# Patient Record
Sex: Male | Born: 1975
Health system: Southern US, Community
[De-identification: ages and names within clinical notes are randomized; demographics above are authoritative.]

## PROBLEM LIST (undated history)

## (undated) DIAGNOSIS — G4733 Obstructive sleep apnea (adult) (pediatric): Secondary | ICD-10-CM

## (undated) DIAGNOSIS — M5134 Other intervertebral disc degeneration, thoracic region: Secondary | ICD-10-CM

## (undated) DIAGNOSIS — E119 Type 2 diabetes mellitus without complications: Secondary | ICD-10-CM

## (undated) HISTORY — PX: NO PAST SURGERIES: SHX2092

## (undated) HISTORY — DX: Type 2 diabetes mellitus without complications: E11.9

## (undated) HISTORY — DX: Obstructive sleep apnea (adult) (pediatric): G47.33

## (undated) HISTORY — DX: Other intervertebral disc degeneration, thoracic region: M51.34

---

## 1998-01-24 ENCOUNTER — Emergency Department (HOSPITAL_COMMUNITY): Admission: EM | Admit: 1998-01-24 | Discharge: 1998-01-24 | Payer: Self-pay | Admitting: Emergency Medicine

## 1998-03-28 ENCOUNTER — Emergency Department (HOSPITAL_COMMUNITY): Admission: EM | Admit: 1998-03-28 | Discharge: 1998-03-28 | Payer: Self-pay | Admitting: Emergency Medicine

## 1999-04-25 ENCOUNTER — Encounter: Payer: Self-pay | Admitting: Emergency Medicine

## 1999-04-25 ENCOUNTER — Emergency Department (HOSPITAL_COMMUNITY): Admission: EM | Admit: 1999-04-25 | Discharge: 1999-04-25 | Payer: Self-pay | Admitting: Emergency Medicine

## 2001-04-06 ENCOUNTER — Emergency Department (HOSPITAL_COMMUNITY): Admission: EM | Admit: 2001-04-06 | Discharge: 2001-04-06 | Payer: Self-pay | Admitting: Emergency Medicine

## 2003-05-05 ENCOUNTER — Emergency Department (HOSPITAL_COMMUNITY): Admission: EM | Admit: 2003-05-05 | Discharge: 2003-05-05 | Payer: Self-pay | Admitting: Emergency Medicine

## 2003-07-05 ENCOUNTER — Emergency Department (HOSPITAL_COMMUNITY): Admission: AD | Admit: 2003-07-05 | Discharge: 2003-07-05 | Payer: Self-pay | Admitting: Family Medicine

## 2003-07-05 ENCOUNTER — Encounter: Payer: Self-pay | Admitting: Family Medicine

## 2004-11-16 ENCOUNTER — Emergency Department (HOSPITAL_COMMUNITY): Admission: EM | Admit: 2004-11-16 | Discharge: 2004-11-16 | Payer: Self-pay | Admitting: Family Medicine

## 2005-06-04 ENCOUNTER — Ambulatory Visit: Payer: Self-pay | Admitting: Internal Medicine

## 2005-08-18 ENCOUNTER — Emergency Department (HOSPITAL_COMMUNITY): Admission: EM | Admit: 2005-08-18 | Discharge: 2005-08-18 | Payer: Self-pay | Admitting: Emergency Medicine

## 2005-09-18 ENCOUNTER — Encounter: Admission: RE | Admit: 2005-09-18 | Discharge: 2005-10-31 | Payer: Self-pay | Admitting: Family Medicine

## 2005-11-28 ENCOUNTER — Ambulatory Visit: Payer: Self-pay | Admitting: Internal Medicine

## 2005-11-29 ENCOUNTER — Ambulatory Visit: Payer: Self-pay | Admitting: Internal Medicine

## 2005-12-05 ENCOUNTER — Ambulatory Visit (HOSPITAL_BASED_OUTPATIENT_CLINIC_OR_DEPARTMENT_OTHER): Admission: RE | Admit: 2005-12-05 | Discharge: 2005-12-05 | Payer: Self-pay | Admitting: Internal Medicine

## 2005-12-16 ENCOUNTER — Ambulatory Visit: Payer: Self-pay | Admitting: Internal Medicine

## 2006-08-08 DIAGNOSIS — L301 Dyshidrosis [pompholyx]: Secondary | ICD-10-CM | POA: Insufficient documentation

## 2006-08-08 DIAGNOSIS — R7309 Other abnormal glucose: Secondary | ICD-10-CM | POA: Insufficient documentation

## 2006-08-08 DIAGNOSIS — K219 Gastro-esophageal reflux disease without esophagitis: Secondary | ICD-10-CM | POA: Insufficient documentation

## 2006-09-11 DIAGNOSIS — B353 Tinea pedis: Secondary | ICD-10-CM | POA: Insufficient documentation

## 2007-09-13 ENCOUNTER — Emergency Department (HOSPITAL_COMMUNITY): Admission: EM | Admit: 2007-09-13 | Discharge: 2007-09-13 | Payer: Self-pay | Admitting: Emergency Medicine

## 2007-11-09 ENCOUNTER — Emergency Department (HOSPITAL_COMMUNITY): Admission: EM | Admit: 2007-11-09 | Discharge: 2007-11-10 | Payer: Self-pay | Admitting: Emergency Medicine

## 2008-02-11 ENCOUNTER — Emergency Department (HOSPITAL_COMMUNITY): Admission: EM | Admit: 2008-02-11 | Discharge: 2008-02-11 | Payer: Self-pay | Admitting: Emergency Medicine

## 2008-02-20 ENCOUNTER — Emergency Department (HOSPITAL_COMMUNITY): Admission: EM | Admit: 2008-02-20 | Discharge: 2008-02-20 | Payer: Self-pay | Admitting: Emergency Medicine

## 2010-10-14 ENCOUNTER — Encounter: Payer: Self-pay | Admitting: Chiropractor

## 2011-02-09 NOTE — Procedures (Signed)
Cory Lewis, Cory Lewis                ACCOUNT NO.:  [id number]   MEDICAL RECORD NO.:  [id number]          PATIENT TYPE:  OUT   LOCATION:  SLEEP CENTER                 FACILITY:  [facility]   PHYSICIAN:  Clinton D. Maple Hudson, M.D. DATE OF BIRTH:  [birth year]   DATE OF STUDY:  12/05/2005                              NOCTURNAL POLYSOMNOGRAM   REFERRING PHYSICIAN:  Dr. Coralie Carpen.   DATE OF STUDY:  December 05, 2005.   INDICATION FOR STUDY:  Hypersomnia with sleep apnea.   EPWORTH SLEEPINESS SCORE:  5/24.   BMI:  43.   WEIGHT:  240 pounds.   HOME MEDICATIONS:  No home medications listed.   A diagnostic NPSG was requested.   SLEEP ARCHITECTURE:  Total sleep time 351 minutes with sleep efficiency 99%.  Stage I was 3%, stage II 61%, stages III and IV 22%, REM 14% of total sleep  time.  Sleep latency 2 minutes, REM latency 140 minutes, awake after sleep  onset 3 minutes, arousal index 26 indicating increased fragmentation.  No  bedtime medications reported.  This study was done as a daytime study  coordinated with the patient's third shift work schedule.  Sleep onset was  at 9:23 a.m. and recording ended at 3:18 p.m.   RESPIRATORY DATA:  Apnea/hypopnea index (AHI, RDI) 25.8 obstructive events  per hour indicating moderate obstructive sleep apnea/hypopnea syndrome.  This reflected 105 obstructive apneas and 46 hypopneas.  Most sleep and  therefore most events were reported while supine.  Late in the study, he  slept for while on his right side and reported obstructive events in that  position as well.  REM RDI 97.2.  NPSG protocol was requested and followed.   OXYGEN DATA:  Extremely loud snoring with oxygen desaturation to a nadir of  8%.  Mean oxygen saturation through the study was 95% on room air.   CARDIAC DATA:  Normal sinus rhythm.   MOVEMENT/PARASOMNIA:  Occasional limb jerks with little effect on sleep.  No  bathroom trips.   IMPRESSION/RECOMMENDATIONS:  1.  Daytime study  coordinated with the patient's third shift work schedule.  2.  Moderate obstructive sleep apnea/hypopnea syndrome, AHI 25.8 per hour      with most events and most sleep while supine.  Events were more common      while in REM (AHI 97.2).  Very loud snoring with oxygen desaturation to      a nadir of 80% on room air.  3.  Consider return for C-PAP titration or evaluate for alternative      therapies as appropriate.      Clinton D. Maple Hudson, M.D.  Diplomate, Biomedical engineer of Sleep Medicine  Electronically Signed     CDY/MEDQ  D:  12/16/2005 11:03:40  T:  12/18/2005 00:14:13  Job:  [id number]

## 2011-06-20 LAB — POCT I-STAT, CHEM 8
BUN: 8
Calcium, Ion: 1.16
Chloride: 103
Creatinine, Ser: 1.1
HCT: 39
TCO2: 24

## 2011-06-20 LAB — CBC
HCT: 37.7 — ABNORMAL LOW
Hemoglobin: 12.8 — ABNORMAL LOW
MCV: 87.9
WBC: 6.2

## 2011-06-20 LAB — URINALYSIS, ROUTINE W REFLEX MICROSCOPIC
Nitrite: NEGATIVE
Specific Gravity, Urine: 1.03
pH: 5.5

## 2011-06-20 LAB — DIFFERENTIAL
Basophils Absolute: 0
Eosinophils Relative: 1

## 2011-06-29 LAB — DIFFERENTIAL
Basophils Absolute: 0.1
Basophils Relative: 1
Eosinophils Absolute: 0.1 — ABNORMAL LOW
Neutrophils Relative %: 33 — ABNORMAL LOW

## 2011-06-29 LAB — CBC
MCHC: 34.8
MCV: 86.6
RBC: 5
RDW: 12.7

## 2011-06-29 LAB — BASIC METABOLIC PANEL: BUN: 9

## 2011-12-16 ENCOUNTER — Emergency Department (INDEPENDENT_AMBULATORY_CARE_PROVIDER_SITE_OTHER)
Admission: EM | Admit: 2011-12-16 | Discharge: 2011-12-16 | Disposition: A | Discharge status: Home / Self Care | Payer: Self-pay | Source: Home / Self Care

## 2011-12-16 ENCOUNTER — Encounter (HOSPITAL_COMMUNITY): Payer: Self-pay | Admitting: *Deleted

## 2011-12-16 DIAGNOSIS — B356 Tinea cruris: Secondary | ICD-10-CM

## 2011-12-16 DIAGNOSIS — M545 Low back pain, unspecified: Secondary | ICD-10-CM

## 2011-12-16 MED ORDER — TRAMADOL HCL 50 MG PO TABS: 50.0000 mg | ORAL_TABLET | Freq: Four times a day (QID) | ORAL | Status: AC | PRN

## 2011-12-16 MED ORDER — IBUPROFEN 800 MG PO TABS: 800.0000 mg | ORAL_TABLET | Freq: Three times a day (TID) | ORAL | Status: AC

## 2011-12-16 MED ORDER — KETOCONAZOLE 2 % EX CREA: TOPICAL_CREAM | Freq: Two times a day (BID) | CUTANEOUS | Status: DC

## 2011-12-16 MED ORDER — KETOCONAZOLE 2 % EX CREA: TOPICAL_CREAM | Freq: Two times a day (BID) | CUTANEOUS | Status: AC

## 2011-12-16 NOTE — ED Provider Notes (Signed)
Medical screening examination/treatment/procedure(s) were performed by non-physician practitioner and as supervising physician I was immediately available for consultation/collaboration.  Raynald Blend, MD 12/16/11 [phone]

## 2011-12-16 NOTE — ED Provider Notes (Signed)
History     CSN: [id number]  Arrival date & time 12/16/11  1331   None     Chief Complaint  Patient presents with  . Back Pain  . Testicle Pain    (Consider location/radiation/quality/duration/timing/severity/associated sxs/prior treatment) HPI Comments: Patient presents with c/o low back pain x 1 week. Pain is primarily in Rt lower back, but also sometimes Lt lower back. No trauma or previous hx of low back pain. Pain is worse with prolonged sitting or lying. He has tried Nyquil for pain relief without improvement. Intermittently he will have a sharp pain from his Lt lower back radiate to his Lt groin and testicle area. He notices this more with movement of his Lt leg while lying in bed. He also reports an itchy dry rash of his groin x 2 weeks, "like jock itch." He denies dysuria or penile discharge. He is not concerned today for STDs.    Past Medical History  Diagnosis Date  . Asthma     History reviewed. No pertinent past surgical history.  History reviewed. No pertinent family history.  History  Substance Use Topics  . Smoking status: Never Smoker   . Smokeless tobacco: Not on file  . Alcohol Use: No      Review of Systems  Allergies  Review of patient's allergies indicates no known allergies.  Home Medications   Current Outpatient Rx  Name Route Sig Dispense Refill  . IBUPROFEN 800 MG PO TABS Oral Take 1 tablet (800 mg total) by mouth 3 (three) times daily. 15 tablet 0  . KETOCONAZOLE 2 % EX CREA Topical Apply topically 2 (two) times daily. 15 g 0  . TRAMADOL HCL 50 MG PO TABS Oral Take 1 tablet (50 mg total) by mouth every 6 (six) hours as needed for pain. 12 tablet 0    BP 131/72  Pulse 75  Temp(Src) 98.2 F (36.8 C) (Oral)  Resp 16  SpO2 100%  Physical Exam  Nursing note and vitals reviewed. Constitutional: He appears well-developed and well-nourished. No distress.  HENT:  Head: Normocephalic and atraumatic.  Cardiovascular: Normal rate, regular  rhythm and normal heart sounds.   Pulmonary/Chest: Effort normal and breath sounds normal. No respiratory distress.  Abdominal: Soft. Bowel sounds are normal. He exhibits no distension and no mass. There is no tenderness.  Genitourinary: Testes normal.       Penis not examined - covered with his hand. Hyperpigmented mildly dry rash noted bilat groin.  Musculoskeletal:       Lumbar back: He exhibits normal range of motion, no tenderness, no bony tenderness, no swelling, no deformity and no spasm.       Neg SLR bilat.   Neurological:  Reflex Scores:      Patellar reflexes are 2+ on the right side and 2+ on the left side. Skin: Skin is warm and dry.  Psychiatric: He has a normal mood and affect.    ED Course  Procedures (including critical care time)  Labs Reviewed - No data to display No results found.   1. Lumbar back pain   2. Tinea cruris       MDM          Melody Comas, PA 12/16/11 1529

## 2011-12-16 NOTE — Discharge Instructions (Signed)
Back Pain, Adult Low back pain is very common. About 1 in 5 people have back pain.The cause of low back pain is rarely dangerous. The pain often gets better over time.About half of people with a sudden onset of back pain feel better in just 2 weeks. About 8 in 10 people feel better by 6 weeks.  CAUSES Some common causes of back pain include:  Strain of the muscles or ligaments supporting the spine.   Wear and tear (degeneration) of the spinal discs.   Arthritis.   Direct injury to the back.  DIAGNOSIS Most of the time, the direct cause of low back pain is not known.However, back pain can be treated effectively even when the exact cause of the pain is unknown.Answering your caregiver's questions about your overall health and symptoms is one of the most accurate ways to make sure the cause of your pain is not dangerous. If your caregiver needs more information, he or she may order lab work or imaging tests (X-rays or MRIs).However, even if imaging tests show changes in your back, this usually does not require surgery. HOME CARE INSTRUCTIONS For many people, back pain returns.Since low back pain is rarely dangerous, it is often a condition that people can learn to manageon their own.   Remain active. It is stressful on the back to sit or stand in one place. Do not sit, drive, or stand in one place for more than 30 minutes at a time. Take short walks on level surfaces as soon as pain allows.Try to increase the length of time you walk each day.   Do not stay in bed.Resting more than 1 or 2 days can delay your recovery.   Do not avoid exercise or work.Your body is made to move.It is not dangerous to be active, even though your back may hurt.Your back will likely heal faster if you return to being active before your pain is gone.   Pay attention to your body when you bend and lift. Many people have less discomfortwhen lifting if they bend their knees, keep the load close to their  bodies,and avoid twisting. Often, the most comfortable positions are those that put less stress on your recovering back.   Find a comfortable position to sleep. Use a firm mattress and lie on your side with your knees slightly bent. If you lie on your back, put a pillow under your knees.   Only take over-the-counter or prescription medicines as directed by your caregiver. Over-the-counter medicines to reduce pain and inflammation are often the most helpful.Your caregiver may prescribe muscle relaxant drugs.These medicines help dull your pain so you can more quickly return to your normal activities and healthy exercise.   Put ice on the injured area.   Put ice in a plastic bag.   Place a towel between your skin and the bag.   Leave the ice on for 15 to 20 minutes, 3 to 4 times a day for the first 2 to 3 days. After that, ice and heat may be alternated to reduce pain and spasms.   Ask your caregiver about trying back exercises and gentle massage. This may be of some benefit.   Avoid feeling anxious or stressed.Stress increases muscle tension and can worsen back pain.It is important to recognize when you are anxious or stressed and learn ways to manage it.Exercise is a great option.  SEEK MEDICAL CARE IF:  You have pain that is not relieved with rest or medicine.   You have   pain that does not improve in 1 week.   You have new symptoms.   You are generally not feeling well.  SEEK IMMEDIATE MEDICAL CARE IF:   You have pain that radiates from your back into your legs.   You develop new bowel or bladder control problems.   You have unusual weakness or numbness in your arms or legs.   You develop nausea or vomiting.   You develop abdominal pain.   You feel faint.  Document Released: 09/10/2005 Document Revised: 08/30/2011 Document Reviewed: 01/29/2011 [facility] Patient Information 2012 [place], [place].  Jock Itch Jock itch is a germ infection of the groin and upper thighs. The  type of germ that causes jock itch is a fungus. It is itchy and often feels like it is burning. It is common in people who play sports. Sweating and wearing certain athletic gear can cause this type of rash. HOME CARE  Take your medicines as told. Finish them even if you start to feel better.   Wear loose-fitting clothing.   Men should wear cotton boxer shorts.   Women should wear cotton underwear.   Avoid hot baths.   Dry the groin area well after bathing.  GET HELP RIGHT AWAY IF:   Your rash is worse or spreading.   Your rash returns after treatment is finished.   Your rash is not gone in 4 weeks.   The area becomes red, warm, tender, and puffy (swollen).   You have a fever.  MAKE SURE YOU:  Understand these instructions.   Will watch your condition.   Will get help right away if you are not doing well or get worse.  Document Released: 12/05/2009 Document Revised: 08/30/2011 Document Reviewed: 12/05/2009 [facility] Patient Information 2012 [place], [place].

## 2011-12-16 NOTE — ED Notes (Signed)
Pt with c/o low back pain - pain left testicle and rash x one week - denies swelling - denies urinary symptoms or penile discharge

## 2013-03-16 ENCOUNTER — Encounter (HOSPITAL_COMMUNITY): Payer: Self-pay | Admitting: Emergency Medicine

## 2013-03-16 ENCOUNTER — Emergency Department (HOSPITAL_COMMUNITY)
Admission: EM | Admit: 2013-03-16 | Discharge: 2013-03-16 | Disposition: A | Discharge status: Home / Self Care | Payer: 59 | Source: Home / Self Care | Attending: Emergency Medicine | Admitting: Emergency Medicine

## 2013-03-16 ENCOUNTER — Emergency Department (INDEPENDENT_AMBULATORY_CARE_PROVIDER_SITE_OTHER): Payer: 59

## 2013-03-16 DIAGNOSIS — R1011 Right upper quadrant pain: Secondary | ICD-10-CM

## 2013-03-16 DIAGNOSIS — R0789 Other chest pain: Secondary | ICD-10-CM

## 2013-03-16 LAB — POCT I-STAT, CHEM 8
Chloride: 105 mEq/L (ref 96–112)
Glucose, Bld: 89 mg/dL (ref 70–99)
HCT: 38 % — ABNORMAL LOW (ref 39.0–52.0)
Potassium: 4 mEq/L (ref 3.5–5.1)

## 2013-03-16 MED ORDER — METHOCARBAMOL 500 MG PO TABS: 500.0000 mg | ORAL_TABLET | Freq: Three times a day (TID) | ORAL | Status: DC

## 2013-03-16 MED ORDER — TRAMADOL HCL 50 MG PO TABS: 100.0000 mg | ORAL_TABLET | Freq: Three times a day (TID) | ORAL | Status: DC | PRN

## 2013-03-16 MED ORDER — OMEPRAZOLE 20 MG PO CPDR: 20.0000 mg | DELAYED_RELEASE_CAPSULE | Freq: Two times a day (BID) | ORAL | Status: DC

## 2013-03-16 MED ORDER — MELOXICAM 15 MG PO TABS: 15.0000 mg | ORAL_TABLET | Freq: Every day | ORAL | Status: DC

## 2013-03-16 MED ORDER — ACETAMINOPHEN 325 MG PO TABS
650.0000 mg | ORAL_TABLET | Freq: Once | ORAL | Status: AC
  Administered 2013-03-16: 650 mg via ORAL

## 2013-03-16 MED ORDER — SUCRALFATE 1 G PO TABS: ORAL_TABLET | ORAL | Status: DC

## 2013-03-16 NOTE — ED Provider Notes (Addendum)
Chief Complaint:   Chief Complaint  Patient presents with  . Abdominal Pain    History of Present Illness:   Cory Lewis is  A 37 year old male who presents today with chest pain and abdominal pain.  1. Chest pain: This has been going on for about 3 days. The pain is localized to the upper sternal area without radiation. It's worse if he turns his neck, especially to the right side. The pain is rated an 8/10 at the worst but now is down to 5. It's not exertional, is described as an ache. He denies any associated nausea, diaphoresis, or shortness of breath. He has not had any fever, chills, coughing, wheezing, or palpitations. He has no cardiac history. He denies any specific neck pain, however on examination there is no pain with palpation and with movement of the neck. He denies any radiation down the arms or numbness, tingling, or weakness in the upper extremities.  2. Abdominal pain: This is been going on off and on for about 3 weeks. It's localized to the right upper quadrant and is rated a 5/10 in intensity. It's worse after eating any food or drinking cold beverages. It will usually last about an hour at a time. It's not associated with nausea, indigestion, or heartburn. The pain does not radiate to the back. He's had no constipation, diarrhea, or blood in the stool. He's had no melena. GU denies any urinary symptoms. He denies any history of ulcer, gastritis, reflux, or gallbladder problems.  Review of Systems:  Other than noted above, the patient denies any of the following symptoms. Systemic:  No fever, chills, sweats, or fatigue. ENT:  No nasal congestion, rhinorrhea, or sore throat. Pulmonary:  No cough, wheezing, shortness of breath, sputum production, hemoptysis. Cardiac:  No palpitations, rapid heartbeat, dizziness, presyncope or syncope. GI:  No abdominal pain, heartburn, nausea, or vomiting. Ext:  No leg pain or swelling.  PMFSH:  Past medical history, family history, social  history, meds, and allergies were reviewed and updated as needed.  Physical Exam:   Vital signs:  BP 123/56  Pulse 54  Temp(Src) 98.6 F (37 C) (Oral)  Resp 20  SpO2 100% Gen:  Alert, oriented, in no distress, skin warm and dry. Eye:  PERRL, lids and conjunctivas normal.  Sclera non-icteric. ENT:  Mucous membranes moist, pharynx clear. Neck:  Tenderness to palpation over both trapezius ridges and over the sternocleidomastoid muscles, anterior neck, and posterior neck as well. The neck has limited range of motion with pain on movement of the neck. Lungs:  Clear to auscultation, no wheezes, rales or rhonchi.  No respiratory distress. Heart:  Regular rhythm.  No gallops, murmers, clicks or rubs. Chest:  No chest wall tenderness. Abdomen:  Soft, flat and nondistended. He has minimal epigastric pain to palpation without guarding or rebound. There is no right quadrant tenderness to palpation Murphy sign and Murphy's punch were negative. No organomegaly.  Bowel sounds normal.  No pulsatile abdominal mass or bruit. Ext:  No edema.  No calf tenderness and Homann's sign negative.  Pulses full and equal. Skin:  Warm and dry.  No rash.  Labs:   Results for orders placed during the hospital encounter of 03/16/13  POCT I-STAT, CHEM 8      Result Value Range   Sodium 142  135 - 145 mEq/L   Potassium 4.0  3.5 - 5.1 mEq/L   Chloride 105  96 - 112 mEq/L   BUN 10  6 - 23  mg/dL   Creatinine, Ser 9.60  0.50 - 1.35 mg/dL   Glucose, Bld 89  70 - 99 mg/dL   Calcium, Ion 4.54  0.98 - 1.23 mmol/L   TCO2 27  0 - 100 mmol/L   Hemoglobin 12.9 (*) 13.0 - 17.0 g/dL   HCT 11.9 (*) 14.7 - 82.9 %     Radiology:  Dg Chest 2 View  03/16/2013   *RADIOLOGY REPORT*  Clinical Data: 37 year old male with upper sternal chest pain with motion.  CHEST - 2 VIEW  Comparison: 08/18/2005.  Findings: Normal lung volumes.  Cardiac size at the upper limits of normal. Other mediastinal contours are within normal limits. Visualized  tracheal air column is within normal limits.  No pneumothorax, pulmonary edema, pleural effusion or confluent pulmonary opacity. No osseous abnormality identified.  Arms are not completely raised on the lateral, obscuring the sternomanubrial junction.  IMPRESSION: Negative, no acute cardiopulmonary abnormality.   Original Report Authenticated By: Erskine Speed, M.D.   I reviewed the images independently and personally and concur with the radiologist's findings.  EKG:   Date: 03/16/2013  Rate: 45  Rhythm: sinus bradycardia  QRS Axis: normal  Intervals: normal  ST/T Wave abnormalities: normal  Conduction Disutrbances:none  Narrative Interpretation: Sinus bradycardia, otherwise normal EKG.  Old EKG Reviewed: none available  Assessment:  The primary encounter diagnosis was Musculoskeletal chest pain. A diagnosis of RUQ pain was also pertinent to this visit.  The chest pain appears to be musculoskeletal. There is nothing to suggest a cardiac cause for the pain. He does have marked bradycardia, but he appeared to tolerate this well with normal blood pressure and no symptoms of dizziness or syncope. Will treat for musculoskeletal pain with Mobic, Robaxin, and tramadol.  The abdominal pain may be ulcer disease, gastritis, reflux esophagitis, or gallbladder disease. I offered to send him to the hospital for further evaluation, but he was not inclined to do this. He will followup with a gastroenterologist in the meantime was started on omeprazole and Carafate.   Plan:   1.  The following meds were prescribed:   Discharge Medication List as of 03/16/2013  6:04 PM    START taking these medications   Details  meloxicam (MOBIC) 15 MG tablet Take 1 tablet (15 mg total) by mouth daily., Starting 03/16/2013, Until Discontinued, Normal    methocarbamol (ROBAXIN) 500 MG tablet Take 1 tablet (500 mg total) by mouth 3 (three) times daily., Starting 03/16/2013, Until Discontinued, Normal    omeprazole (PRILOSEC)  20 MG capsule Take 1 capsule (20 mg total) by mouth 2 (two) times daily before a meal., Starting 03/16/2013, Until Discontinued, Normal    sucralfate (CARAFATE) 1 G tablet 1 4 times daily before meals and at bedtime, Normal    traMADol (ULTRAM) 50 MG tablet Take 2 tablets (100 mg total) by mouth every 8 (eight) hours as needed for pain., Starting 03/16/2013, Until Discontinued, Normal       2.  The patient was instructed in symptomatic care and handouts were given. 3.  The patient was told to return if becoming worse in any way, if no better in 3 or 4 days, and given some red flag symptoms such as worsening pain or difficulty breathing that would indicate earlier return. 4.  Follow up with Dr. Bernette Redbird for the abdominal pain.    Reuben Likes, MD 03/16/13 2033  Reuben Likes, MD 03/16/13 [phone]

## 2013-03-16 NOTE — ED Notes (Signed)
Reports right epigastric pain, onset 3 weeks ago.  Pain feels like something sticking into patient from the inside.  No nausea, no vomiting , no diarrhea.  Second complaint is patient having pain in center chest with turning head to the right, reports this has been occuring for 2 days

## 2014-08-08 ENCOUNTER — Encounter (HOSPITAL_COMMUNITY): Payer: Self-pay | Admitting: Emergency Medicine

## 2014-08-08 ENCOUNTER — Emergency Department (HOSPITAL_COMMUNITY): Payer: 59

## 2014-08-08 ENCOUNTER — Emergency Department (HOSPITAL_COMMUNITY)
Admission: EM | Admit: 2014-08-08 | Discharge: 2014-08-08 | Disposition: A | Discharge status: Home / Self Care | Payer: Self-pay | Attending: Emergency Medicine | Admitting: Emergency Medicine

## 2014-08-08 DIAGNOSIS — R52 Pain, unspecified: Secondary | ICD-10-CM

## 2014-08-08 DIAGNOSIS — R292 Abnormal reflex: Secondary | ICD-10-CM | POA: Insufficient documentation

## 2014-08-08 DIAGNOSIS — Z79899 Other long term (current) drug therapy: Secondary | ICD-10-CM | POA: Insufficient documentation

## 2014-08-08 DIAGNOSIS — E669 Obesity, unspecified: Secondary | ICD-10-CM | POA: Insufficient documentation

## 2014-08-08 DIAGNOSIS — J45909 Unspecified asthma, uncomplicated: Secondary | ICD-10-CM | POA: Insufficient documentation

## 2014-08-08 DIAGNOSIS — M5134 Other intervertebral disc degeneration, thoracic region: Secondary | ICD-10-CM | POA: Insufficient documentation

## 2014-08-08 LAB — CBC WITH DIFFERENTIAL/PLATELET
BASOS PCT: 0 % (ref 0–1)
Basophils Absolute: 0 10*3/uL (ref 0.0–0.1)
EOS PCT: 1 % (ref 0–5)
Eosinophils Absolute: 0.1 10*3/uL (ref 0.0–0.7)
HEMATOCRIT: 38.9 % — AB (ref 39.0–52.0)
HEMOGLOBIN: 12.9 g/dL — AB (ref 13.0–17.0)
LYMPHS ABS: 2 10*3/uL (ref 0.7–4.0)
Lymphocytes Relative: 40 % (ref 12–46)
MCH: 29.6 pg (ref 26.0–34.0)
MCHC: 33.2 g/dL (ref 30.0–36.0)
MCV: 89.2 fL (ref 78.0–100.0)
MONOS PCT: 8 % (ref 3–12)
Monocytes Absolute: 0.4 10*3/uL (ref 0.1–1.0)
Neutro Abs: 2.5 10*3/uL (ref 1.7–7.7)
Neutrophils Relative %: 51 % (ref 43–77)
PLATELETS: 286 10*3/uL (ref 150–400)
RBC: 4.36 MIL/uL (ref 4.22–5.81)
RDW: 13.1 % (ref 11.5–15.5)
WBC: 4.9 10*3/uL (ref 4.0–10.5)

## 2014-08-08 LAB — COMPREHENSIVE METABOLIC PANEL
ALBUMIN: 4 g/dL (ref 3.5–5.2)
ALK PHOS: 57 U/L (ref 39–117)
ALT: 21 U/L (ref 0–53)
AST: 20 U/L (ref 0–37)
Anion gap: 13 (ref 5–15)
BUN: 11 mg/dL (ref 6–23)
CALCIUM: 9 mg/dL (ref 8.4–10.5)
CO2: 24 meq/L (ref 19–32)
Chloride: 104 mEq/L (ref 96–112)
Creatinine, Ser: 1.12 mg/dL (ref 0.50–1.35)
GFR calc Af Amer: >90 mL/min (ref >90)
GFR, EST NON AFRICAN AMERICAN: 82 mL/min — AB (ref >90)
Glucose, Bld: 121 mg/dL — ABNORMAL HIGH (ref 70–99)
POTASSIUM: 4.3 meq/L (ref 3.7–5.3)
Sodium: 141 mEq/L (ref 137–147)
TOTAL PROTEIN: 7.1 g/dL (ref 6.0–8.3)

## 2014-08-08 LAB — LIPASE, BLOOD: LIPASE: 46 U/L (ref 11–59)

## 2014-08-08 LAB — URINALYSIS, ROUTINE W REFLEX MICROSCOPIC
Bilirubin Urine: NEGATIVE
GLUCOSE, UA: NEGATIVE mg/dL
Hgb urine dipstick: NEGATIVE
Ketones, ur: NEGATIVE mg/dL
LEUKOCYTES UA: NEGATIVE
NITRITE: NEGATIVE
Protein, ur: NEGATIVE mg/dL
Specific Gravity, Urine: 1.025 (ref 1.005–1.030)
UROBILINOGEN UA: 0.2 mg/dL (ref 0.0–1.0)
pH: 7 (ref 5.0–8.0)

## 2014-08-08 MED ORDER — OXYCODONE-ACETAMINOPHEN 5-325 MG PO TABS
1.0000 | ORAL_TABLET | Freq: Once | ORAL | Status: AC
  Administered 2014-08-08: 1 via ORAL
  Filled 2014-08-08: qty 1

## 2014-08-08 MED ORDER — IBUPROFEN 200 MG PO TABS: 400.0000 mg | ORAL_TABLET | Freq: Four times a day (QID) | ORAL | Status: AC | PRN

## 2014-08-08 MED ORDER — KETOROLAC TROMETHAMINE 30 MG/ML IJ SOLN
30.0000 mg | Freq: Once | INTRAMUSCULAR | Status: AC
  Administered 2014-08-08: 30 mg via INTRAMUSCULAR
  Filled 2014-08-08: qty 1

## 2014-08-08 MED ORDER — METAXALONE 800 MG PO TABS
800.0000 mg | ORAL_TABLET | Freq: Once | ORAL | Status: AC
  Administered 2014-08-08: 800 mg via ORAL
  Filled 2014-08-08: qty 1

## 2014-08-08 MED ORDER — METAXALONE 800 MG PO TABS: 800.0000 mg | ORAL_TABLET | Freq: Three times a day (TID) | ORAL | Status: DC

## 2014-08-08 NOTE — ED Notes (Signed)
Patient transported to X-ray 

## 2014-08-08 NOTE — ED Provider Notes (Signed)
CSN: [id number]     Arrival date & time 08/08/14  1829 History   First MD Initiated Contact with Patient 08/08/14 2039     Chief Complaint  Patient presents with  . Back Pain     (Consider location/radiation/quality/duration/timing/severity/associated sxs/prior Treatment) HPI Comments: Patient states he has back pain that is located in thoracic area and radiates to R lower rib area.  It has been going on for about 2 weeks and is worse with any movement, deep breathing.  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 Denies limp, fall, weakness in extremity, pain does not radiate to legs   Patient is a 38 y.o. male presenting with back pain. The history is provided by the patient.  Back Pain Location:  Thoracic spine Quality:  Aching Radiates to: R ribs. Pain severity:  Moderate Pain is:  Same all the time Onset quality:  Unable to specify Duration:  2 weeks Timing:  Intermittent Progression:  Worsening Chronicity:  New Context: twisting   Relieved by:  Nothing Worsened by:  Bending, deep breathing, coughing, movement, sneezing and twisting Ineffective treatments: alleve. Associated symptoms: no abdominal pain, no abdominal swelling, no bladder incontinence, no bowel incontinence, no chest pain, no dysuria, no fever, no leg pain, no numbness, no paresthesias, no perianal numbness, no tingling and no weakness   Risk factors: obesity     Past Medical History  Diagnosis Date  . Asthma    History reviewed. No pertinent past surgical history. No family history on file. History  Substance Use Topics  . Smoking status: Never Smoker   . Smokeless tobacco: Not on file  . Alcohol Use: No    Review of Systems  Constitutional: Negative for fever.  Respiratory: Negative for cough and shortness of breath.   Cardiovascular: Negative for chest pain and leg swelling.  Gastrointestinal: Negative  for abdominal pain, diarrhea, constipation and bowel incontinence.  Genitourinary: Negative for bladder incontinence, dysuria and flank pain.  Musculoskeletal: Positive for back pain.  Neurological: Negative for tingling, weakness, numbness and paresthesias.  All other systems reviewed and are negative.     Allergies  Review of patient's allergies indicates no known allergies.  Home Medications   Prior to Admission medications   Medication Sig Start Date End Date Taking? Authorizing Provider  Multiple Vitamin (MULTIVITAMIN WITH MINERALS) TABS tablet Take 1 tablet by mouth daily.   Yes Historical Provider, MD  ibuprofen (ADVIL,MOTRIN) 200 MG tablet Take 2 tablets (400 mg total) by mouth every 6 (six) hours as needed for moderate pain. 08/08/14   Arman FilterGail K Naya Ilagan, NP  metaxalone (SKELAXIN) 800 MG tablet Take 1 tablet (800 mg total) by mouth 3 (three) times daily. 08/08/14   Arman FilterGail K Pamlea Finder, NP   BP 122/74 mmHg  Pulse 65  Temp(Src) 97.6 F (36.4 C) (Oral)  Resp 16  Ht 5\' 3"  (1.6 m)  Wt 240 lb (108.863 kg)  BMI 42.52 kg/m2  SpO2 98% Physical Exam  Constitutional: He appears well-developed and well-nourished.  obese  HENT:  Head: Normocephalic.  Eyes: Pupils are equal, round, and reactive to light.  Neck: Normal range of motion. Neck supple.  Cardiovascular: Normal rate and regular rhythm.   Pulmonary/Chest: Effort normal and breath sounds normal.  Abdominal: Soft. He exhibits no distension. There is no tenderness.  Musculoskeletal: Normal range of motion. He exhibits no edema or tenderness.  Back:  Lymphadenopathy:    He has no cervical adenopathy.  Neurological: He is alert. He displays abnormal reflex.  Skin: Skin is warm.  Nursing note and vitals reviewed.   ED Course  Procedures (including critical care time) Labs Review Labs Reviewed  CBC WITH DIFFERENTIAL - Abnormal; Notable for the following:    Hemoglobin 12.9 (*)    HCT 38.9 (*)    All other components  within normal limits  COMPREHENSIVE METABOLIC PANEL - Abnormal; Notable for the following:    Glucose, Bld 121 (*)    Total Bilirubin <0.2 (*)    GFR calc non Af Amer 82 (*)    All other components within normal limits  LIPASE, BLOOD  URINALYSIS, ROUTINE W REFLEX MICROSCOPIC    Imaging Review Dg Chest 2 View  08/08/2014   CLINICAL DATA:  Right side chest pain, and mid back pain X 2 weeks. No known injury. Pain not radiating any where else, mostly hurts when he moves around. Hx of asthma.  EXAM: CHEST  2 VIEW  COMPARISON:  03/16/2013  FINDINGS: Midline trachea. Borderline cardiomegaly. Mediastinal contours otherwise within normal limits. No pleural effusion or pneumothorax. Mildly low lung volumes. This accentuates the pulmonary interstitium. Clear lungs.  IMPRESSION: No active cardiopulmonary disease.   Electronically Signed   By: Jeronimo GreavesKyle  Talbot M.D.   On: 08/08/2014 21:52   Dg Thoracolumbar Spine  08/08/2014   CLINICAL DATA:  Right-sided chest pain and mid back pain for 2 weeks. No known injury.  EXAM: THORACOLUMBAR SPINE - 2 VIEW  COMPARISON:  Chest 03/16/2013  FINDINGS: Views of the lower thoracic and lumbar spine are obtained. Mild thoracic curvature convex towards the left. Degenerative changes in the lower thoracic region with narrowed interspaces and associated endplate hypertrophic changes. No vertebral compression deformities. No focal bone lesion or bone destruction. Bone cortex and trabecular architecture appear intact.  IMPRESSION: Mild scoliosis and degenerative changes in the lower thoracic spine. No displaced fractures identified.   Electronically Signed   By: Burman NievesWilliam  Stevens M.D.   On: 08/08/2014 21:53     EKG Interpretation None      MDM  X rays show degenerative disc disease wil DC home with Rx for Skelaxin and Ibuprofen have give resource list to help patient  find PCP Final diagnoses:  Pain  Degenerative disc disease, thoracic        Arman FilterGail K Delise Simenson, NP 08/08/14  2219  Lyanne CoKevin M Campos, MD 08/11/14 [phone]

## 2014-08-08 NOTE — ED Notes (Signed)
Pt reports back pain x 2 weeks that is worse with movement. sts pain occasionally radiates to rib cage. Pt also c/o intermittent RUQ "pinching" pain x 1 month. Denies nvd, constipation. Pt does report increased fatigue.

## 2014-08-08 NOTE — Discharge Instructions (Signed)
°Emergency Department Resource Guide °1) Find a Doctor and Pay Out of Pocket °Although you won't have to find out who is covered by your insurance plan, it is a good idea to ask around and get recommendations. You will then need to call the office and see if the doctor you have chosen will accept you as a new patient and what types of options they offer for patients who are self-pay. Some doctors offer discounts or will set up payment plans for their patients who do not have insurance, but you will need to ask so you aren't surprised when you get to your appointment. ° °2) Contact Your Local Health Department °Not all health departments have doctors that can see patients for sick visits, but many do, so it is worth a call to see if yours does. If you don't know where your local health department is, you can check in your phone book. The CDC also has a tool to help you locate your state's health department, and many state websites also have listings of all of their local health departments. ° °3) Find a Walk-in Clinic °If your illness is not likely to be very severe or complicated, you may want to try a walk in clinic. These are popping up all over the country in pharmacies, drugstores, and shopping centers. They're usually staffed by nurse practitioners or physician assistants that have been trained to treat common illnesses and complaints. They're usually fairly quick and inexpensive. However, if you have serious medical issues or chronic medical problems, these are probably not your best option. ° °No Primary Care Doctor: °- Call [facility] at  832-8000 - they can help you locate a primary care doctor that  accepts your insurance, provides certain services, etc. °- Physician Referral Service- 1-800-533-3463 ° °Chronic Pain Problems: °Organization         Address  Phone   Notes  °[facility]  (336) 297-2271 Patients need to be referred by their primary care doctor.  ° °Medication  Assistance: °Organization         Address  Phone   Notes  °[place] [facility] [address] °[place], [place] [address] (336) 641-8030 --Must be a resident of [place] °-- Must have NO insurance coverage whatsoever (no Medicaid/ Medicare, etc.) °-- The pt. MUST have a primary care doctor that directs their care regularly and follows them in the community °  °[facility]  (866) 331-1348   °[employer]  (888) 892-1162   ° °Agencies that provide inexpensive medical care: °Organization         Address  Phone   Notes  °[facility]  (336) 832-8035   °[facility]    (336) 832-7272   °[facility] [address] °[place], [place] [address] (336) 832-4777   °[facility] [address], °[place] (336) 271-4999   °[facility]    (336) 373-0678   °[facility]    (336) 272-1050   °[facility] ° [address], [place] Phone:  (336) 832-4444, Fax:  (336) 832-4440 Hours of Operation:  9 am - 6 pm, M-F.  Also accepts Medicaid/Medicare and self-pay.  °[facility] ° [address], [place] Phone: (336) 832-3150, Fax: (336) 832-3151. Hours of Operation:  8:30 am - 5:30 pm, M-F.  Also accepts Medicaid and self-pay.  °[facility] [place] [address], [place] Phone: (336) 878-6027   °[facility] [address], [place], [place] (336)723-1848, Ext. 123 Mondays & Thursdays: 7-9 AM.  First 15 patients are seen on a first come, first serve basis. °  ° °Medicaid-accepting [place] Providers: ° °Organization         Address  Phone   Notes  °[facility] [address], [place] (336) 641-2100 Also accepts self-pay patients.  °[facility] [address], [place] ° (336) 856-9996   °[facility] [address], [place]  (336) 288-8857   °[facility] [address] [place] [address], [place] (336) 299-7000   °Veita Bland [address], [place]  ° (336) 373-1557 Only accepts [place] Access Medicaid patients after they have their name applied to their card.  ° °Self-Pay (no insurance) in [place]: ° °Organization         Address  Phone   Notes  °Sickle Cell Patients, [facility] [address], [place] (336) 832-1970   °[facility] [address], [place] (336) 832-4400   °[facility] ° [address], [place] (336) 992-4800   °[facility]/Dr. Osei-Bonsu ° [address] [place] [address], [place] or [address], [place] (336) 841-8500 Phone number for both [place] and [place] locations is the same.  °[facility] [address], [place] (336) 299-0000   °[facility] [address] [place] [address], [place] or [address] (336) 852-7530 °(336) 878-2260   °[facility] [address], [place] (336) 350-1642, phone; (336) 294-5005, fax Sees patients 1st and 3rd Saturday of every month.  Must not qualify for public or private insurance (i.e. Medicaid, Medicare, [place] Health Choice, Veterans' Benefits) • Household income should be no more than 200% of the poverty level •The clinic cannot treat you if you are pregnant or think you are pregnant • Sexually transmitted diseases are not treated at the clinic.  ° ° °Dental Care: °Organization         Address  Phone  Notes  °[place] [facility] [address], [place] (336) 641-6152 Accepts children up to age 21 who are enrolled in Medicaid or [place] Health Choice; pregnant women with a Medicaid card; and children who have applied for Medicaid or [place] Health Choice, but were declined, whose parents can pay a reduced fee at time of service.  °[place]  [facility] [place]  [address], [place] (336) 641-7733 Accepts children up to age 21 who are enrolled in Medicaid or [place] Health Choice; pregnant women with a Medicaid card; and children who have applied for Medicaid or  Health Choice, but were declined, whose parents can pay a reduced fee at time of service.  °[facility] ° [address],  (336) 641-4533 Patients are seen by appointment only. Walk-ins are not accepted. [facility] will see patients 18 years of age and older. °Monday - Tuesday (8am-5pm) °Most Wednesdays (8:30-5pm) °$30 per visit, cash only  °[facility] ° [address], [place] (336) 641-4533 Patients are seen by appointment only. Walk-ins are not accepted. [facility] will see patients 18 years of age and older. °One   Wednesday Evening (Monthly: Volunteer Based).  $30 per visit, cash only  °[facility]  (919) 537-3737 for adults; Children under age 4, call Graduate Pediatric Dentistry at ([phone]) [phone]. Children aged 4-14, please call (919) [phone] to request a pediatric application. ° Dental services are provided in all areas of dental care including fillings, crowns and bridges, complete and partial dentures, implants, gum treatment, root canals, and extractions. Preventive care is also provided. Treatment is provided to both adults and children. °Patients are selected via a lottery and there is often a waiting list. °  °[facility] [address], °[place] ° (336) 763-8833 [email or web address] °  °[facility] [address], [place], [place] (336)723-1848, Ext. 123 Second and Fourth Thursday of each month, opens at 6:30 AM; Clinic ends at 9 AM.  Patients are seen on a first-come first-served basis, and a limited number are seen during each clinic.  ° °[facility] ° [address], [place], [place] (336) 723-7904    Eligibility Requirements °You must have lived in [place], [place], or [place] counties for at least the last three months. °  You cannot be eligible for state or federal sponsored healthcare insurance, including Veterans Administration, Medicaid, or Medicare. °  You generally cannot be eligible for healthcare insurance through your employer.  °  How to apply: °Eligibility screenings are held every Tuesday and Wednesday afternoon from 1:00 pm until 4:00 pm. You do not need an appointment for the interview!  °[facility] [address], [place], [place] 336-631-2330   °[facility]  336-342-8273   °[facility]  336-703-3100   °[facility]  336-570-6415   ° °Behavioral Health Resources in the Community: °Intensive Outpatient Programs °Organization         Address  Phone  Notes  °[facility] [address], [place], [place] 336-878-6098   °[facility] [address], [place], [place] 336-832-9800   °[facility] [address], [place], [place] ° 336-882-2125   °[facility] [address],  °[place], [place] 1-800-853-5163 or 336-641-4981   °Substance Abuse Resources °Organization         Address  Phone  Notes  °[facility]  336-882-2125   °[facility]  336-784-9470   °[facility]  336-285-9073   °[facility]  336-845-3988   °Residential & Outpatient Substance Abuse Program  1-800-659-3381   °Psychological Services °Organization         Address  Phone  Notes  °[facility]  336- 832-9600   °[facility]  336- 378-7881   °[facility] [address], [place] 1-800-853-5163 or 336-641-4981   ° °Mobile Crisis Teams °Organization         Address  Phone  Notes  °[facility], Mobile Crisis Care Unit  1-877-626-1772   °Assertive °[facility] ° [address]  [place], [place] 336-834-9664   °Sharon DeEsch [address] °[place] 336-554-5454   ° °Self-Help/Support Groups °Organization         Address  Phone             Notes  °[facility] [place] [facility] - variety of support groups  336- 373-1402 Call for more information  °Narcotics Anonymous (NA), [facility] [address], °[place]   2 meetings at this location  ° °  Residential Treatment Programs °Organization         Address  Phone  Notes  °[facility] [address],    °[place]  1-866-801-8205   °[facility] ° [address], [place], [place] 704-293-8524   °[facility] [address], [place] 336-845-3988 Admissions: 8am-3pm M-F  °[facility] [address]    °[place], [place] 336-841-1104   °[facility] [address], [place], [place] 336-379-7146   °[facility] [address]  °[place], [place] 336-285-9073   °[facility] - Intensive Outpatient [address], [place], [place] 336-852-3033   °[facility] [address]  °[place], [place] 1-877-615-2722 or 336-784-9470   °[facility] [address] [place], [place] 336-227-7417 Accepts Medicaid  °[facility] [address]  °[place] 1-800-659-3381 Substance Abuse/Addiction Treatment  ° °[place] Behavioral Health Resources °Organization         Address  Phone  Notes  °[facility]  (888) 581-9988   °Julie Brannon, PhD [address] [place], [place]   (336) 349-5553 or (336) 951-0000   °[facility]   [address] °[place], [place] (336) 349-4454   °[facility] [address], [place], [place] (336) 342-8316 Insurance/Medicaid/sponsorship through [facility]  °[facility] [address]                                    [place], [place] (336) 342-8316 Therapy/tele-psych/case    °[facility] [address]  ° [place], [place] (336) 349-2233    °Dr. Arfeen  (336) 349-4544   °[facility] [place]  [employer] [place] [facility] 1) [address], [place] °2) [address], [place] °3)  [address], [place] (336) 349-3220 °(336) 342-7768 ° °(336) 342-8140   °[place] [facility] (336) 342-1394 or (336) 342-3537 (After Hours)    ° ° °

## 2015-01-17 ENCOUNTER — Other Ambulatory Visit: Payer: Self-pay | Admitting: Sports Medicine

## 2015-01-17 DIAGNOSIS — M25511 Pain in right shoulder: Secondary | ICD-10-CM

## 2015-02-05 ENCOUNTER — Ambulatory Visit
Admission: RE | Admit: 2015-02-05 | Discharge: 2015-02-05 | Disposition: A | Discharge status: Home / Self Care | Payer: 59 | Source: Ambulatory Visit | Attending: Sports Medicine | Admitting: Sports Medicine

## 2015-02-05 DIAGNOSIS — M25511 Pain in right shoulder: Secondary | ICD-10-CM

## 2016-12-27 ENCOUNTER — Ambulatory Visit (HOSPITAL_COMMUNITY)
Admission: EM | Admit: 2016-12-27 | Discharge: 2016-12-27 | Disposition: A | Discharge status: Home / Self Care | Payer: 59 | Attending: Family Medicine | Admitting: Family Medicine

## 2016-12-27 ENCOUNTER — Encounter (HOSPITAL_COMMUNITY): Payer: Self-pay | Admitting: *Deleted

## 2016-12-27 DIAGNOSIS — H1030 Unspecified acute conjunctivitis, unspecified eye: Secondary | ICD-10-CM

## 2016-12-27 MED ORDER — POLYMYXIN B-TRIMETHOPRIM 10000-0.1 UNIT/ML-% OP SOLN: 2.0000 [drp] | OPHTHALMIC | 0 refills | Status: DC

## 2016-12-27 NOTE — ED Notes (Signed)
l  Eye is red  And irritated  And     Is  Draining           The   Eye  Feels    sensative  To  Light

## 2016-12-27 NOTE — ED Triage Notes (Signed)
Eye  Problem  For  1-2  Years          Pt  Noticed    l  Eye  Is  irriatted   And      Crusty    With  Drainage

## 2016-12-27 NOTE — ED Provider Notes (Signed)
CSN: [id number]     Arrival date & time 12/27/16  1713 History   First MD Initiated Contact with Patient 12/27/16 1735     Chief Complaint  Patient presents with  . Eye Problem   (Consider location/radiation/quality/duration/timing/severity/associated sxs/prior Treatment) Patient has eye discharge and eye redness in left eye for last 2 days.   The history is provided by the patient.  Eye Problem  Location:  Left eye Quality:  Tearing Severity:  Moderate Onset quality:  Sudden Duration:  2 days Timing:  Constant Progression:  Worsening Chronicity:  New Relieved by:  Nothing Worsened by:  Nothing Ineffective treatments:  None tried Associated symptoms: discharge and redness     Past Medical History:  Diagnosis Date  . Asthma    History reviewed. No pertinent surgical history. History reviewed. No pertinent family history. Social History  Substance Use Topics  . Smoking status: Never Smoker  . Smokeless tobacco: Not on file  . Alcohol use No    Review of Systems  Constitutional: Negative.   HENT: Negative.   Eyes: Positive for discharge and redness.  Respiratory: Negative.   Cardiovascular: Negative.   Gastrointestinal: Negative.   Endocrine: Negative.   Genitourinary: Negative.   Musculoskeletal: Negative.   Allergic/Immunologic: Negative.   Neurological: Negative.   Hematological: Negative.   Psychiatric/Behavioral: Negative.     Allergies  Patient has no known allergies.  Home Medications   Prior to Admission medications   Medication Sig Start Date End Date Taking? Authorizing Provider  ibuprofen (ADVIL,MOTRIN) 200 MG tablet Take 2 tablets (400 mg total) by mouth every 6 (six) hours as needed for moderate pain. 08/08/14   Earley Favor, NP  metaxalone (SKELAXIN) 800 MG tablet Take 1 tablet (800 mg total) by mouth 3 (three) times daily. 08/08/14   Earley Favor, NP  Multiple Vitamin (MULTIVITAMIN WITH MINERALS) TABS tablet Take 1 tablet by mouth daily.     Historical Provider, MD  trimethoprim-polymyxin b (POLYTRIM) ophthalmic solution Place 2 drops into the left eye every 4 (four) hours. 12/27/16   Deatra Canter, FNP   Meds Ordered and Administered this Visit  Medications - No data to display  BP (!) 144/77 (BP Location: Right Arm) Comment: notified rn  Pulse 77   Temp 98.3 F (36.8 C) (Oral)   Resp 16   SpO2 99%  No data found.   Physical Exam  Constitutional: He is oriented to person, place, and time. He appears well-developed and well-nourished.  HENT:  Head: Normocephalic and atraumatic.  Right Ear: External ear normal.  Left Ear: External ear normal.  Mouth/Throat: Oropharynx is clear and moist.  Eyes: EOM are normal. Pupils are equal, round, and reactive to light. Right eye exhibits discharge.  Left conjunctiva erythematous with yellow DC and sclera erythematous  Cardiovascular: Normal rate, regular rhythm and normal heart sounds.   Pulmonary/Chest: Effort normal and breath sounds normal.  Musculoskeletal: Normal range of motion.  Neurological: He is alert and oriented to person, place, and time.  Nursing note and vitals reviewed.   Urgent Care Course     Procedures (including critical care time)  Labs Review Labs Reviewed - No data to display  Imaging Review No results found.   Visual Acuity Review  Right Eye Distance: 20/30 Left Eye Distance: 20/20 Bilateral Distance: 20/20  Right Eye Near:   Left Eye Near:    Bilateral Near:         MDM   1. Acute bacterial conjunctivitis, unspecified laterality  Polytrim eye gtt's     Deatra Canter, FNP 12/27/16 1850
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
## 2017-04-08 ENCOUNTER — Encounter: Payer: Self-pay | Admitting: Medical

## 2017-08-14 DIAGNOSIS — R0683 Snoring: Secondary | ICD-10-CM | POA: Diagnosis not present

## 2017-08-14 DIAGNOSIS — R739 Hyperglycemia, unspecified: Secondary | ICD-10-CM | POA: Diagnosis not present

## 2017-08-21 DIAGNOSIS — R739 Hyperglycemia, unspecified: Secondary | ICD-10-CM | POA: Diagnosis not present

## 2017-11-29 DIAGNOSIS — R739 Hyperglycemia, unspecified: Secondary | ICD-10-CM | POA: Diagnosis not present

## 2017-11-29 DIAGNOSIS — K219 Gastro-esophageal reflux disease without esophagitis: Secondary | ICD-10-CM | POA: Diagnosis not present

## 2018-12-23 DIAGNOSIS — J309 Allergic rhinitis, unspecified: Secondary | ICD-10-CM | POA: Diagnosis not present

## 2019-10-14 ENCOUNTER — Ambulatory Visit: Payer: 59 | Admitting: Family Medicine

## 2019-10-28 ENCOUNTER — Ambulatory Visit: Payer: 59 | Attending: Family Medicine | Admitting: Family Medicine

## 2019-10-28 ENCOUNTER — Encounter: Payer: Self-pay | Admitting: Family Medicine

## 2019-10-28 ENCOUNTER — Other Ambulatory Visit: Payer: Self-pay

## 2019-10-28 ENCOUNTER — Other Ambulatory Visit: Payer: Self-pay | Admitting: Pharmacist

## 2019-10-28 VITALS — BP 120/68 | HR 78 | Ht 63.0 in | Wt 246.0 lb

## 2019-10-28 DIAGNOSIS — E1165 Type 2 diabetes mellitus with hyperglycemia: Secondary | ICD-10-CM | POA: Diagnosis not present

## 2019-10-28 DIAGNOSIS — Z1159 Encounter for screening for other viral diseases: Secondary | ICD-10-CM

## 2019-10-28 DIAGNOSIS — E119 Type 2 diabetes mellitus without complications: Secondary | ICD-10-CM | POA: Insufficient documentation

## 2019-10-28 LAB — GLUCOSE, POCT (MANUAL RESULT ENTRY): POC Glucose: 149 mg/dl — AB (ref 70–99)

## 2019-10-28 LAB — POCT GLYCOSYLATED HEMOGLOBIN (HGB A1C): HbA1c, POC (controlled diabetic range): 7.6 % — AB (ref 0.0–7.0)

## 2019-10-28 MED ORDER — ATORVASTATIN CALCIUM 20 MG PO TABS: 20.0000 mg | ORAL_TABLET | Freq: Every day | ORAL | 3 refills | Status: DC

## 2019-10-28 MED ORDER — METFORMIN HCL 500 MG PO TABS
500.0000 mg | ORAL_TABLET | Freq: Two times a day (BID) | ORAL | 3 refills | Status: DC
Start: 2019-10-28 — End: 2020-01-27

## 2019-10-28 MED ORDER — GLIPIZIDE 5 MG PO TABS: 5.0000 mg | ORAL_TABLET | Freq: Two times a day (BID) | ORAL | 3 refills | Status: DC

## 2019-10-28 MED ORDER — ONETOUCH DELICA LANCETS 33G MISC: 11 refills | Status: DC

## 2019-10-28 MED ORDER — ONETOUCH VERIO VI STRP: ORAL_STRIP | 11 refills | Status: DC

## 2019-10-28 MED ORDER — ONETOUCH VERIO W/DEVICE KIT: PACK | 0 refills | Status: DC

## 2019-10-28 NOTE — Progress Notes (Signed)
Subjective:  Patient ID: Cory Lewis, male    DOB: 04-12-1976  Age: 44 y.o. MRN: [id number]  CC: New Patient (Initial Visit)   HPI [name] presents to establish care.  Medical history significant for type 2 diabetes mellitus with an A1c of 7.6. Previously followed by [name] "but then something happened". He sometimes stretches his medications when he cannot afford it. Does not have a glucometer and he has not been checking his sugars  He denies presence of chest pain, dyspnea, pedal edema. Past Medical History:  Diagnosis Date  . Asthma   . Diabetes mellitus without complication [facility])     Past Surgical History:  Procedure Laterality Date  . NO PAST SURGERIES      Family History  Problem Relation Age of Onset  . Diabetes Mother   . Diabetes Father     No Known Allergies  Outpatient Medications Prior to Visit  Medication Sig Dispense Refill  . omeprazole (PRILOSEC) 20 MG capsule Take 20 mg by mouth every morning.    . pioglitazone-metformin (ACTOPLUS MET) 15-500 MG tablet Take 1 tablet by mouth 2 (two) times daily.    Marland Kitchen ibuprofen (ADVIL,MOTRIN) 200 MG tablet Take 2 tablets (400 mg total) by mouth every 6 (six) hours as needed for moderate pain. (Patient not taking: Reported on 10/28/2019) 30 tablet 0  . metaxalone (SKELAXIN) 800 MG tablet Take 1 tablet (800 mg total) by mouth 3 (three) times daily. (Patient not taking: Reported on 10/28/2019) 30 tablet 0  . Multiple Vitamin (MULTIVITAMIN WITH MINERALS) TABS tablet Take 1 tablet by mouth daily.    Marland Kitchen trimethoprim-polymyxin b (POLYTRIM) ophthalmic solution Place 2 drops into the left eye every 4 (four) hours. (Patient not taking: Reported on 10/28/2019) 10 mL 0   No facility-administered medications prior to visit.     ROS Review of Systems  Constitutional: Negative for activity change and appetite change.  HENT: Negative for sinus pressure and sore throat.   Eyes: Negative for visual disturbance.  Respiratory: Negative  for cough, chest tightness and shortness of breath.   Cardiovascular: Negative for chest pain and leg swelling.  Gastrointestinal: Negative for abdominal distention, abdominal pain, constipation and diarrhea.  Endocrine: Negative.   Genitourinary: Negative for dysuria.  Musculoskeletal: Negative for joint swelling and myalgias.  Skin: Negative for rash.  Allergic/Immunologic: Negative.   Neurological: Negative for weakness, light-headedness and numbness.  Psychiatric/Behavioral: Negative for dysphoric mood and suicidal ideas.    Objective:  BP 120/68   Pulse 78   Ht 5' 3"  (1.6 m)   Wt 246 lb (111.6 kg)   SpO2 97%   BMI 43.58 kg/m   BP/Weight 10/28/2019 12/27/2016 94/85/4627  Systolic BP 035 009 381  Diastolic BP 68 77 83  Wt. (Lbs) 246 - 240  BMI 43.58 - 42.52      Physical Exam Constitutional:      Appearance: He is well-developed.  Neck:     Vascular: No JVD.  Cardiovascular:     Rate and Rhythm: Normal rate.     Heart sounds: Normal heart sounds. No murmur.  Pulmonary:     Effort: Pulmonary effort is normal.     Breath sounds: Normal breath sounds. No wheezing or rales.  Chest:     Chest wall: No tenderness.  Abdominal:     General: Bowel sounds are normal. There is no distension.     Palpations: Abdomen is soft. There is no mass.     Tenderness: There is no abdominal  tenderness.  Musculoskeletal:        General: Normal range of motion.     Right lower leg: No edema.     Left lower leg: No edema.  Neurological:     Mental Status: He is alert and oriented to person, place, and time.  Psychiatric:        Mood and Affect: Mood normal.     CMP Latest Ref Rng & Units 08/08/2014 03/16/2013 02/11/2008  Glucose 70 - 99 mg/dL 121(H) 89 151(H)  BUN 6 - 23 mg/dL 11 10 8   Creatinine 0.50 - 1.35 mg/dL 1.12 0.90 1.1  Sodium 137 - 147 mEq/L 141 142 137  Potassium 3.7 - 5.3 mEq/L 4.3 4.0 3.8  Chloride 96 - 112 mEq/L 104 105 103  CO2 19 - 32 mEq/L 24 - -  Calcium 8.4 -  10.5 mg/dL 9.0 - -  Total Protein 6.0 - 8.3 g/dL 7.1 - -  Total Bilirubin 0.3 - 1.2 mg/dL <0.2(L) - -  Alkaline Phos 39 - 117 U/L 57 - -  AST 0 - 37 U/L 20 - -  ALT 0 - 53 U/L 21 - -    Lipid Panel  No results found for: CHOL, TRIG, HDL, CHOLHDL, VLDL, LDLCALC, LDLDIRECT  CBC    Component Value Date/Time   WBC 4.9 08/08/2014 1850   RBC 4.36 08/08/2014 1850   HGB 12.9 (L) 08/08/2014 1850   HCT 38.9 (L) 08/08/2014 1850   PLT 286 08/08/2014 1850   MCV 89.2 08/08/2014 1850   MCH 29.6 08/08/2014 1850   MCHC 33.2 08/08/2014 1850   RDW 13.1 08/08/2014 1850   LYMPHSABS 2.0 08/08/2014 1850   MONOABS 0.4 08/08/2014 1850   EOSABS 0.1 08/08/2014 1850   BASOSABS 0.0 08/08/2014 1850    Lab Results  Component Value Date   HGBA1C 7.6 (A) 10/28/2019     Assessment & Plan:   1. Type 2 diabetes mellitus with hyperglycemia, without long-term current use of insulin (HCC) Sleep demise with A1c of 7.6; goal is less than 7 We will switch from Actos/Metformin to Metformin and glipizide due to cost Educated about hypoglycemia and he knows to notify the clinic if this occurs. If blood sugars remain elevated above goal consider increasing Metformin to 1000 mg twice daily Prescribed glucometer and testing supplies Commenced on statin for primary prevention of CVD Counseled on Diabetic diet, my plate method, 025 minutes of moderate intensity exercise/week Blood sugar logs with fasting goals of 80-120 mg/dl, random of less than 180 and in the event of sugars less than 60 mg/dl or greater than 400 mg/dl encouraged to notify the clinic. Advised on the need for annual eye exams, annual foot exams, Pneumonia vaccine. - POCT glucose (manual entry) - POCT glycosylated hemoglobin (Hb A1C) - glipiZIDE (GLUCOTROL) 5 MG tablet; Take 1 tablet (5 mg total) by mouth 2 (two) times daily before a meal.  Dispense: 60 tablet; Refill: 3 - metFORMIN (GLUCOPHAGE) 500 MG tablet; Take 1 tablet (500 mg total) by mouth 2  (two) times daily with a meal.  Dispense: 60 tablet; Refill: 3 - atorvastatin (LIPITOR) 20 MG tablet; Take 1 tablet (20 mg total) by mouth daily.  Dispense: 30 tablet; Refill: 3 - Microalbumin / creatinine urine ratio - CMP14+EGFR  2. Screening for viral disease - HIV Antibody (routine testing w rflx)   Charlott Rakes, MD, FAAFP. [facility] and [facility]   10/28/2019, 3:51 PM

## 2019-10-28 NOTE — Patient Instructions (Signed)
Blood Glucose Monitoring, Adult Monitoring your blood sugar (glucose) is an important part of managing your diabetes (diabetes mellitus). Blood glucose monitoring involves checking your blood glucose as often as directed and keeping a record (log) of your results over time. Checking your blood glucose regularly and keeping a blood glucose log can:  Help you and your health care provider adjust your diabetes management plan as needed, including your medicines or insulin.  Help you understand how food, exercise, illnesses, and medicines affect your blood glucose.  Let you know what your blood glucose is at any time. You can quickly find out if you have low blood glucose (hypoglycemia) or high blood glucose (hyperglycemia). Your health care provider will set individualized treatment goals for you. Your goals will be based on your age, other medical conditions you have, and how you respond to diabetes treatment. Generally, the goal of treatment is to maintain the following blood glucose levels:  Before meals (preprandial): 80-130 mg/dL (4.4-7.2 mmol/L).  After meals (postprandial): below 180 mg/dL (10 mmol/L).  A1c level: less than 7%. Supplies needed:  Blood glucose meter.  Test strips for your meter. Each meter has its own strips. You must use the strips that came with your meter.  A needle to prick your finger (lancet). Do not use a lancet more than one time.  A device that holds the lancet (lancing device).  A journal or log book to write down your results. How to check your blood glucose  1. Wash your hands with soap and water. 2. Prick the side of your finger (not the tip) with the lancet. Use a different finger each time. 3. Gently rub the finger until a small drop of blood appears. 4. Follow instructions that come with your meter for inserting the test strip, applying blood to the strip, and using your blood glucose meter. 5. Write down your result and any notes. Some meters  allow you to use areas of your body other than your finger (alternative sites) to test your blood. The most common alternative sites are:  Forearm.  Thigh.  Palm of the hand. If you think you may have hypoglycemia, or if you have a history of not knowing when your blood glucose is getting low (hypoglycemia unawareness), do not use alternative sites. Use your finger instead. Alternative sites may not be as accurate as the fingers, because blood flow is slower in these areas. This means that the result you get may be delayed, and it may be different from the result that you would get from your finger. Follow these instructions at home: Blood glucose log   Every time you check your blood glucose, write down your result. Also write down any notes about things that may be affecting your blood glucose, such as your diet and exercise for the day. This information can help you and your health care provider: ? Look for patterns in your blood glucose over time. ? Adjust your diabetes management plan as needed.  Check if your meter allows you to download your records to a computer. Most glucose meters store a record of glucose readings in the meter. If you have type 1 diabetes:  Check your blood glucose 2 or more times a day.  Also check your blood glucose: ? Before every insulin injection. ? Before and after exercise. ? Before meals. ? 2 hours after a meal. ? Occasionally between 2:00 a.m. and 3:00 a.m., as directed. ? Before potentially dangerous tasks, like driving or using heavy machinery. ?   At bedtime.  You may need to check your blood glucose more often, up to 6-10 times a day, if you: ? Use an insulin pump. ? Need multiple daily injections (MDI). ? Have diabetes that is not well-controlled. ? Are ill. ? Have a history of severe hypoglycemia. ? Have hypoglycemia unawareness. If you have type 2 diabetes:  If you take insulin or other diabetes medicines, check your blood glucose 2 or  more times a day.  If you are on intensive insulin therapy, check your blood glucose 4 or more times a day. Occasionally, you may also need to check between 2:00 a.m. and 3:00 a.m., as directed.  Also check your blood glucose: ? Before and after exercise. ? Before potentially dangerous tasks, like driving or using heavy machinery.  You may need to check your blood glucose more often if: ? Your medicine is being adjusted. ? Your diabetes is not well-controlled. ? You are ill. General tips  Always keep your supplies with you.  If you have questions or need help, all blood glucose meters have a 24-hour "hotline" phone number that you can call. You may also contact your health care provider.  After you use a few boxes of test strips, adjust (calibrate) your blood glucose meter by following instructions that came with your meter. Contact a health care provider if:  Your blood glucose is at or above 240 mg/dL (13.3 mmol/L) for 2 days in a row.  You have been sick or have had a fever for 2 days or longer, and you are not getting better.  You have any of the following problems for more than 6 hours: ? You cannot eat or drink. ? You have nausea or vomiting. ? You have diarrhea. Get help right away if:  Your blood glucose is lower than 54 mg/dL (3 mmol/L).  You become confused or you have trouble thinking clearly.  You have difficulty breathing.  You have moderate or large ketone levels in your urine. Summary  Monitoring your blood sugar (glucose) is an important part of managing your diabetes (diabetes mellitus).  Blood glucose monitoring involves checking your blood glucose as often as directed and keeping a record (log) of your results over time.  Your health care provider will set individualized treatment goals for you. Your goals will be based on your age, other medical conditions you have, and how you respond to diabetes treatment.  Every time you check your blood glucose,  write down your result. Also write down any notes about things that may be affecting your blood glucose, such as your diet and exercise for the day. This information is not intended to replace advice given to you by your health care provider. Make sure you discuss any questions you have with your health care provider. Document Revised: 07/04/2018 Document Reviewed: 02/20/2016 Elsevier Patient Education  2020 Elsevier Inc.  

## 2019-10-29 ENCOUNTER — Telehealth: Payer: Self-pay

## 2019-10-29 LAB — CMP14+EGFR
ALT: 15 IU/L (ref 0–44)
AST: 19 IU/L (ref 0–40)
Albumin/Globulin Ratio: 1.9 (ref 1.2–2.2)
Albumin: 4.6 g/dL (ref 4.0–5.0)
Alkaline Phosphatase: 60 IU/L (ref 39–117)
BUN/Creatinine Ratio: 9 (ref 9–20)
BUN: 10 mg/dL (ref 6–24)
Bilirubin Total: <0.2 mg/dL (ref 0.0–1.2)
CO2: 26 mmol/L (ref 20–29)
Calcium: 10.3 mg/dL — ABNORMAL HIGH (ref 8.7–10.2)
Chloride: 101 mmol/L (ref 96–106)
Creatinine, Ser: 1.16 mg/dL (ref 0.76–1.27)
GFR calc Af Amer: 89 mL/min/{1.73_m2} (ref >59)
GFR calc non Af Amer: 77 mL/min/{1.73_m2} (ref >59)
Globulin, Total: 2.4 g/dL (ref 1.5–4.5)
Glucose: 103 mg/dL — ABNORMAL HIGH (ref 65–99)
Potassium: 4.5 mmol/L (ref 3.5–5.2)
Sodium: 142 mmol/L (ref 134–144)
Total Protein: 7 g/dL (ref 6.0–8.5)

## 2019-10-29 LAB — MICROALBUMIN / CREATININE URINE RATIO
Creatinine, Urine: 96.2 mg/dL
Microalb/Creat Ratio: 5 mg/g creat (ref 0–29)
Microalbumin, Urine: 4.6 ug/mL

## 2019-10-29 LAB — HIV ANTIBODY (ROUTINE TESTING W REFLEX): HIV Screen 4th Generation wRfx: NONREACTIVE

## 2019-10-29 NOTE — Telephone Encounter (Signed)
-----   Message from Hoy Register, MD sent at 10/29/2019  2:26 PM EST ----- Labs are normal

## 2019-10-29 NOTE — Telephone Encounter (Signed)
Patient name and DOB has been verified Patient was informed of lab results. Patient had no questions.  

## 2020-01-27 ENCOUNTER — Encounter: Payer: Self-pay | Admitting: Family Medicine

## 2020-01-27 ENCOUNTER — Ambulatory Visit: Payer: 59 | Attending: Family Medicine | Admitting: Family Medicine

## 2020-01-27 ENCOUNTER — Other Ambulatory Visit: Payer: Self-pay

## 2020-01-27 VITALS — BP 121/78 | HR 83 | Ht 63.0 in | Wt 254.4 lb

## 2020-01-27 DIAGNOSIS — E1165 Type 2 diabetes mellitus with hyperglycemia: Secondary | ICD-10-CM | POA: Diagnosis not present

## 2020-01-27 DIAGNOSIS — Z6841 Body Mass Index (BMI) 40.0 and over, adult: Secondary | ICD-10-CM | POA: Diagnosis not present

## 2020-01-27 LAB — GLUCOSE, POCT (MANUAL RESULT ENTRY): POC Glucose: 157 mg/dl — AB (ref 70–99)

## 2020-01-27 LAB — POCT GLYCOSYLATED HEMOGLOBIN (HGB A1C): HbA1c, POC (controlled diabetic range): 9.2 % — AB (ref 0.0–7.0)

## 2020-01-27 MED ORDER — METFORMIN HCL 500 MG PO TABS: 1000.0000 mg | ORAL_TABLET | Freq: Two times a day (BID) | ORAL | 6 refills | Status: DC

## 2020-01-27 MED ORDER — GLIPIZIDE 10 MG PO TABS: 10.0000 mg | ORAL_TABLET | Freq: Two times a day (BID) | ORAL | 6 refills | Status: DC

## 2020-01-27 MED ORDER — ATORVASTATIN CALCIUM 20 MG PO TABS: 20.0000 mg | ORAL_TABLET | Freq: Every day | ORAL | 6 refills | Status: DC

## 2020-01-27 NOTE — Patient Instructions (Signed)

## 2020-01-27 NOTE — Progress Notes (Signed)
Subjective:  Patient ID: Cory Lewis, male    DOB: Oct 05, 1975  Age: 44 y.o. MRN: [id number]  CC: Diabetes   HPI Cory Lewis is a 44 year old male with a history of type 2 diabetes mellitus (A1c 9.2 from today) who presents today for follow-up visit. His A1c is 9.1 has trended up from 7.6 previously.  At his last visit Actos/Metformin 15/500 was changed to glipizide 5 mg twice daily 500 mg twice daily due to cost. Fasting sugars are 230, 282 evident from his glucometer.  He tries to eat diet but does not exercise outside of the physical activity he gets at his job as he works in [employer]. He was placed on atorvastatin however he has not been taking it he never received it from the pharmacy. He has no acute concerns today.  Past Medical History:  Diagnosis Date  . Asthma   . Diabetes mellitus without complication [facility])     Past Surgical History:  Procedure Laterality Date  . NO PAST SURGERIES      Family History  Problem Relation Age of Onset  . Diabetes Mother   . Diabetes Father     No Known Allergies  Outpatient Medications Prior to Visit  Medication Sig Dispense Refill  . Blood Glucose Monitoring Suppl (ONETOUCH VERIO) w/Device KIT Use as instructed to check blood sugar daily. E11.65 1 kit 0  . glipiZIDE (GLUCOTROL) 5 MG tablet Take 1 tablet (5 mg total) by mouth 2 (two) times daily before a meal. 60 tablet 3  . glucose blood (ONETOUCH VERIO) test strip Use as instructed to check blood sugar daily. E11.65 100 each 11  . metFORMIN (GLUCOPHAGE) 500 MG tablet Take 1 tablet (500 mg total) by mouth 2 (two) times daily with a meal. 60 tablet 3  . omeprazole (PRILOSEC) 20 MG capsule Take 20 mg by mouth every morning.    Cory Lewis Delica Lancets 38S MISC Use as instructed to check blood sugar daily. E11.65 100 each 11  . atorvastatin (LIPITOR) 20 MG tablet Take 1 tablet (20 mg total) by mouth daily. (Patient not taking: Reported on 01/27/2020) 30 tablet 3  . ibuprofen  (ADVIL,MOTRIN) 200 MG tablet Take 2 tablets (400 mg total) by mouth every 6 (six) hours as needed for moderate pain. (Patient not taking: Reported on 10/28/2019) 30 tablet 0  . metaxalone (SKELAXIN) 800 MG tablet Take 1 tablet (800 mg total) by mouth 3 (three) times daily. (Patient not taking: Reported on 10/28/2019) 30 tablet 0  . Multiple Vitamin (MULTIVITAMIN WITH MINERALS) TABS tablet Take 1 tablet by mouth daily.    Marland Kitchen trimethoprim-polymyxin b (POLYTRIM) ophthalmic solution Place 2 drops into the left eye every 4 (four) hours. (Patient not taking: Reported on 10/28/2019) 10 mL 0   No facility-administered medications prior to visit.     ROS Review of Systems  Constitutional: Negative for activity change and appetite change.  HENT: Negative for sinus pressure and sore throat.   Eyes: Negative for visual disturbance.  Respiratory: Negative for cough, chest tightness and shortness of breath.   Cardiovascular: Negative for chest pain and leg swelling.  Gastrointestinal: Negative for abdominal distention, abdominal pain, constipation and diarrhea.  Endocrine: Negative.   Genitourinary: Negative for dysuria.  Musculoskeletal: Negative for joint swelling and myalgias.  Skin: Negative for rash.  Allergic/Immunologic: Negative.   Neurological: Negative for weakness, light-headedness and numbness.  Psychiatric/Behavioral: Negative for dysphoric mood and suicidal ideas.    Objective:  BP 121/78   Pulse  83   Ht 5' 3"  (1.6 m)   Wt 254 lb 6.4 oz (115.4 kg)   SpO2 96%   BMI 45.06 kg/m   BP/Weight 01/27/2020 05/26/4461 04/29/3816  Systolic BP 711 657 903  Diastolic BP 78 68 77  Wt. (Lbs) 254.4 246 -  BMI 45.06 43.58 -      Physical Exam Constitutional:      Appearance: He is well-developed. He is obese.  Neck:     Vascular: No JVD.  Cardiovascular:     Rate and Rhythm: Normal rate.     Heart sounds: Normal heart sounds. No murmur.  Pulmonary:     Effort: Pulmonary effort is normal.      Breath sounds: Normal breath sounds. No wheezing or rales.  Chest:     Chest wall: No tenderness.  Abdominal:     General: Bowel sounds are normal. There is no distension.     Palpations: Abdomen is soft. There is no mass.     Tenderness: There is no abdominal tenderness.  Musculoskeletal:        General: Normal range of motion.     Right lower leg: No edema.     Left lower leg: No edema.  Neurological:     Mental Status: He is alert and oriented to person, place, and time.  Psychiatric:        Mood and Affect: Mood normal.     CMP Latest Ref Rng & Units 10/28/2019 08/08/2014 03/16/2013  Glucose 65 - 99 mg/dL 103(H) 121(H) 89  BUN 6 - 24 mg/dL 10 11 10   Creatinine 0.76 - 1.27 mg/dL 1.16 1.12 0.90  Sodium 134 - 144 mmol/L 142 141 142  Potassium 3.5 - 5.2 mmol/L 4.5 4.3 4.0  Chloride 96 - 106 mmol/L 101 104 105  CO2 20 - 29 mmol/L 26 24 -  Calcium 8.7 - 10.2 mg/dL 10.3(H) 9.0 -  Total Protein 6.0 - 8.5 g/dL 7.0 7.1 -  Total Bilirubin 0.0 - 1.2 mg/dL <0.2 <0.2(L) -  Alkaline Phos 39 - 117 IU/L 60 57 -  AST 0 - 40 IU/L 19 20 -  ALT 0 - 44 IU/L 15 21 -    Lipid Panel  No results found for: CHOL, TRIG, HDL, CHOLHDL, VLDL, LDLCALC, LDLDIRECT  CBC    Component Value Date/Time   WBC 4.9 08/08/2014 1850   RBC 4.36 08/08/2014 1850   HGB 12.9 (L) 08/08/2014 1850   HCT 38.9 (L) 08/08/2014 1850   PLT 286 08/08/2014 1850   MCV 89.2 08/08/2014 1850   MCH 29.6 08/08/2014 1850   MCHC 33.2 08/08/2014 1850   RDW 13.1 08/08/2014 1850   LYMPHSABS 2.0 08/08/2014 1850   MONOABS 0.4 08/08/2014 1850   EOSABS 0.1 08/08/2014 1850   BASOSABS 0.0 08/08/2014 1850    Lab Results  Component Value Date   HGBA1C 9.2 (A) 01/27/2020    Assessment & Plan:  1. Type 2 diabetes mellitus with hyperglycemia, without long-term current use of insulin (HCC) Uncontrolled with A1c of 9.2; goal is less than 7.0 Increased dose of Metformin and Glipizide Advised to start Lipitor and I have sent  prescription to the pharmacy Counseled on Diabetic diet, my plate method, 833 minutes of moderate intensity exercise/week Blood sugar logs with fasting goals of 80-120 mg/dl, random of less than 180 and in the event of sugars less than 60 mg/dl or greater than 400 mg/dl encouraged to notify the clinic. Advised on the need for annual eye exams,  annual foot exams, Pneumonia vaccine. - POCT glucose (manual entry) - POCT glycosylated hemoglobin (Hb A1C) - metFORMIN (GLUCOPHAGE) 500 MG tablet; Take 2 tablets (1,000 mg total) by mouth 2 (two) times daily with a meal.  Dispense: 120 tablet; Refill: 6 - glipiZIDE (GLUCOTROL) 10 MG tablet; Take 1 tablet (10 mg total) by mouth 2 (two) times daily before a meal.  Dispense: 60 tablet; Refill: 6 - atorvastatin (LIPITOR) 20 MG tablet; Take 1 tablet (20 mg total) by mouth daily.  Dispense: 30 tablet; Refill: 6 - Lipid panel; Future - CMP14+EGFR; Future  2. Class 3 severe obesity due to excess calories with serious comorbidity and body mass index (BMI) of 45.0 to 49.9 in adult [facility]) Counseled on reducing portion sizes, increasing physical activity He plans to start out at the gym next week   Return in about 3 months (around 04/28/2020) for Diabetes.    Charlott Rakes, MD, FAAFP. [facility] and [place], [place]   01/27/2020, 4:22 PM

## 2020-03-29 ENCOUNTER — Other Ambulatory Visit: Payer: Self-pay | Admitting: Family Medicine

## 2020-03-29 DIAGNOSIS — E1165 Type 2 diabetes mellitus with hyperglycemia: Secondary | ICD-10-CM

## 2020-04-21 ENCOUNTER — Encounter: Payer: Self-pay | Admitting: Family Medicine

## 2020-04-25 ENCOUNTER — Telehealth: Payer: Self-pay | Admitting: Family Medicine

## 2020-04-25 DIAGNOSIS — Z1159 Encounter for screening for other viral diseases: Secondary | ICD-10-CM

## 2020-04-25 DIAGNOSIS — Z113 Encounter for screening for infections with a predominantly sexual mode of transmission: Secondary | ICD-10-CM

## 2020-04-25 NOTE — Telephone Encounter (Deleted)
Copied from CRM [id number]. Topic: General - Other >> Apr 22, 2020  2:44 PM Dalphine Handing A wrote: Patient called to let Dr .Alvis Lemmings know he would like her to move forward to place the orders for the std testing . Patient would like a callback once order has been placed. Please advise

## 2020-04-25 NOTE — Telephone Encounter (Signed)
Patient is wanting STD testing done, patient has been set up for a lab appointment for 04/29/2020

## 2020-04-25 NOTE — Telephone Encounter (Signed)
Please advise.  Copied from CRM [id number]. Topic: General - Other >> Apr 22, 2020  2:44 PM Dalphine Handing A wrote: Patient called to let Dr .Alvis Lemmings know he would like her to move forward to place the orders for the std testing . Patient would like a callback once order has been placed. Please advise

## 2020-04-25 NOTE — Telephone Encounter (Signed)
Orders have been placed.

## 2020-04-29 ENCOUNTER — Other Ambulatory Visit: Payer: Self-pay

## 2020-04-29 ENCOUNTER — Ambulatory Visit: Payer: 59 | Attending: Family Medicine

## 2020-04-29 DIAGNOSIS — E1165 Type 2 diabetes mellitus with hyperglycemia: Secondary | ICD-10-CM

## 2020-04-29 DIAGNOSIS — Z113 Encounter for screening for infections with a predominantly sexual mode of transmission: Secondary | ICD-10-CM

## 2020-05-02 ENCOUNTER — Ambulatory Visit: Payer: 59

## 2020-05-02 ENCOUNTER — Encounter: Payer: Self-pay | Admitting: Family Medicine

## 2020-05-02 ENCOUNTER — Other Ambulatory Visit: Payer: Self-pay

## 2020-05-02 ENCOUNTER — Other Ambulatory Visit (HOSPITAL_COMMUNITY)
Admission: RE | Admit: 2020-05-02 | Discharge: 2020-05-02 | Disposition: A | Discharge status: Home / Self Care | Payer: 59 | Source: Ambulatory Visit | Attending: Family Medicine | Admitting: Family Medicine

## 2020-05-02 DIAGNOSIS — Z113 Encounter for screening for infections with a predominantly sexual mode of transmission: Secondary | ICD-10-CM | POA: Insufficient documentation

## 2020-05-02 LAB — CMP14+EGFR
ALT: 28 IU/L (ref 0–44)
AST: 25 IU/L (ref 0–40)
Albumin/Globulin Ratio: 2 (ref 1.2–2.2)
Albumin: 4.9 g/dL (ref 4.0–5.0)
Alkaline Phosphatase: 58 IU/L (ref 48–121)
BUN/Creatinine Ratio: 14 (ref 9–20)
BUN: 17 mg/dL (ref 6–24)
Bilirubin Total: <0.2 mg/dL (ref 0.0–1.2)
CO2: 25 mmol/L (ref 20–29)
Calcium: 9.5 mg/dL (ref 8.7–10.2)
Chloride: 99 mmol/L (ref 96–106)
Creatinine, Ser: 1.22 mg/dL (ref 0.76–1.27)
GFR calc Af Amer: 83 mL/min/{1.73_m2} (ref >59)
GFR calc non Af Amer: 72 mL/min/{1.73_m2} (ref >59)
Globulin, Total: 2.4 g/dL (ref 1.5–4.5)
Glucose: 174 mg/dL — ABNORMAL HIGH (ref 65–99)
Potassium: 4.3 mmol/L (ref 3.5–5.2)
Sodium: 139 mmol/L (ref 134–144)
Total Protein: 7.3 g/dL (ref 6.0–8.5)

## 2020-05-02 LAB — HSV TYPE I/II IGG, IGMW/ REFLEX
HSV 1 Glycoprotein G Ab, IgG: 38.1 index — ABNORMAL HIGH (ref 0.00–0.90)
HSV 2 IgG, Type Spec: 1.38 index — ABNORMAL HIGH (ref 0.00–0.90)

## 2020-05-02 LAB — LIPID PANEL
Chol/HDL Ratio: 3.7 ratio (ref 0.0–5.0)
Cholesterol, Total: 206 mg/dL — ABNORMAL HIGH (ref 100–199)
HDL: 56 mg/dL (ref >39)
LDL Chol Calc (NIH): 98 mg/dL (ref 0–99)
Triglycerides: 313 mg/dL — ABNORMAL HIGH (ref 0–149)
VLDL Cholesterol Cal: 52 mg/dL — ABNORMAL HIGH (ref 5–40)

## 2020-05-02 LAB — HSV-2 IGG SUPPLEMENTAL TEST: HSV-2 IgG Supplemental Test: POSITIVE — AB

## 2020-05-03 ENCOUNTER — Ambulatory Visit: Payer: 59 | Admitting: Family Medicine

## 2020-05-03 LAB — URINE CYTOLOGY ANCILLARY ONLY
Chlamydia: NEGATIVE
Comment: NEGATIVE
Comment: NEGATIVE
Comment: NORMAL
Neisseria Gonorrhea: NEGATIVE
Trichomonas: NEGATIVE

## 2020-05-05 ENCOUNTER — Telehealth: Payer: Self-pay

## 2020-05-05 NOTE — Telephone Encounter (Signed)
Patient name and DOB has been verified Patient was informed of lab results. Patient had no questions.  

## 2020-05-05 NOTE — Telephone Encounter (Signed)
-----   Message from Hoy Register, MD sent at 04/30/2020  3:08 PM EDT ----- I ordered a urine STD test. Can you please follow up on this? Thanks.

## 2020-05-18 ENCOUNTER — Other Ambulatory Visit: Payer: Self-pay | Admitting: Family Medicine

## 2020-05-18 MED ORDER — OMEPRAZOLE 20 MG PO CPDR: 20.0000 mg | DELAYED_RELEASE_CAPSULE | Freq: Every morning | ORAL | 3 refills | Status: DC

## 2020-07-06 ENCOUNTER — Encounter: Payer: Self-pay | Admitting: Family Medicine

## 2020-07-06 ENCOUNTER — Other Ambulatory Visit: Payer: Self-pay

## 2020-07-06 ENCOUNTER — Ambulatory Visit: Payer: 59 | Attending: Family Medicine | Admitting: Family Medicine

## 2020-07-06 VITALS — BP 125/77 | HR 84 | Ht 63.0 in | Wt 251.0 lb

## 2020-07-06 DIAGNOSIS — R29818 Other symptoms and signs involving the nervous system: Secondary | ICD-10-CM | POA: Diagnosis not present

## 2020-07-06 DIAGNOSIS — E1165 Type 2 diabetes mellitus with hyperglycemia: Secondary | ICD-10-CM

## 2020-07-06 DIAGNOSIS — E1169 Type 2 diabetes mellitus with other specified complication: Secondary | ICD-10-CM

## 2020-07-06 DIAGNOSIS — Z6841 Body Mass Index (BMI) 40.0 and over, adult: Secondary | ICD-10-CM

## 2020-07-06 DIAGNOSIS — E785 Hyperlipidemia, unspecified: Secondary | ICD-10-CM

## 2020-07-06 LAB — GLUCOSE, POCT (MANUAL RESULT ENTRY): POC Glucose: 164 mg/dl — AB (ref 70–99)

## 2020-07-06 LAB — POCT GLYCOSYLATED HEMOGLOBIN (HGB A1C): HbA1c, POC (controlled diabetic range): 9 % — AB (ref 0.0–7.0)

## 2020-07-06 MED ORDER — GLIPIZIDE ER 10 MG PO TB24: 10.0000 mg | ORAL_TABLET | Freq: Every day | ORAL | 6 refills | Status: DC

## 2020-07-06 MED ORDER — METFORMIN HCL ER 500 MG PO TB24: 500.0000 mg | ORAL_TABLET | Freq: Every day | ORAL | 6 refills | Status: DC

## 2020-07-06 MED ORDER — METFORMIN HCL ER 500 MG PO TB24: 1000.0000 mg | ORAL_TABLET | Freq: Every day | ORAL | 6 refills | Status: DC

## 2020-07-06 MED ORDER — ATORVASTATIN CALCIUM 40 MG PO TABS: 40.0000 mg | ORAL_TABLET | Freq: Every day | ORAL | 6 refills | Status: DC

## 2020-07-06 MED ORDER — DAPAGLIFLOZIN PROPANEDIOL 10 MG PO TABS: 10.0000 mg | ORAL_TABLET | Freq: Every day | ORAL | 6 refills | Status: DC

## 2020-07-06 NOTE — Patient Instructions (Signed)

## 2020-07-06 NOTE — Progress Notes (Signed)
Subjective:  Patient ID: Cory Lewis, male    DOB: 1975-12-01  Age: 44 y.o. MRN: [id number]  CC: Diabetes   HPI Cory Lewis  is a 44 year old male with a history of type 2 diabetes mellitus (A1c 9.0 from today) who presents today for follow-up visit.   He has ben snoring at night and was informed he stops breathing in his sleep. Has daytime somnolence and fatigue and sometimes would like to be checked for sleep apnea.  States his sugars are sometimes in the 90s and 160s and 200s. States he he gets lazy and snacks sometimes. Denies hypoglycemia, visual concerns or numbness in extremities. With regards to his medications he sometimes rushes out in the morning and forgets to take his morning dose.  He usually has light breakfast will sometimes skips breakfast but does have a huge dinner. Compliant with his statin.  Does not exercise much but from what he gets at his job. Past Medical History:  Diagnosis Date  . Asthma   . Diabetes mellitus without complication [facility])     Past Surgical History:  Procedure Laterality Date  . NO PAST SURGERIES      Family History  Problem Relation Age of Onset  . Diabetes Mother   . Diabetes Father     No Known Allergies  Outpatient Medications Prior to Visit  Medication Sig Dispense Refill  . Blood Glucose Monitoring Suppl (ONETOUCH VERIO) w/Device KIT Use as instructed to check blood sugar daily. E11.65 1 kit 0  . glucose blood (ONETOUCH VERIO) test strip Use as instructed to check blood sugar daily. E11.65 100 each 11  . omeprazole (PRILOSEC) 20 MG capsule Take 1 capsule (20 mg total) by mouth every morning. 30 capsule 3  . OneTouch Delica Lancets 09M MISC Use as instructed to check blood sugar daily. E11.65 100 each 11  . atorvastatin (LIPITOR) 20 MG tablet Take 1 tablet (20 mg total) by mouth daily. 30 tablet 6  . glipiZIDE (GLUCOTROL) 10 MG tablet Take 1 tablet (10 mg total) by mouth 2 (two) times daily before a meal. 60 tablet 6  .  metFORMIN (GLUCOPHAGE) 500 MG tablet Take 2 tablets (1,000 mg total) by mouth 2 (two) times daily with a meal. 120 tablet 6  . ibuprofen (ADVIL,MOTRIN) 200 MG tablet Take 2 tablets (400 mg total) by mouth every 6 (six) hours as needed for moderate pain. (Patient not taking: Reported on 10/28/2019) 30 tablet 0  . metaxalone (SKELAXIN) 800 MG tablet Take 1 tablet (800 mg total) by mouth 3 (three) times daily. (Patient not taking: Reported on 10/28/2019) 30 tablet 0  . Multiple Vitamin (MULTIVITAMIN WITH MINERALS) TABS tablet Take 1 tablet by mouth daily. (Patient not taking: Reported on 07/06/2020)    . trimethoprim-polymyxin b (POLYTRIM) ophthalmic solution Place 2 drops into the left eye every 4 (four) hours. (Patient not taking: Reported on 10/28/2019) 10 mL 0   No facility-administered medications prior to visit.     ROS Review of Systems  Constitutional: Negative for activity change and appetite change.  HENT: Negative for sinus pressure and sore throat.   Eyes: Negative for visual disturbance.  Respiratory: Negative for cough, chest tightness and shortness of breath.   Cardiovascular: Negative for chest pain and leg swelling.  Gastrointestinal: Negative for abdominal distention, abdominal pain, constipation and diarrhea.  Endocrine: Negative.   Genitourinary: Negative for dysuria.  Musculoskeletal: Negative for joint swelling and myalgias.  Skin: Negative for rash.  Allergic/Immunologic: Negative.   Neurological: Negative  for weakness, light-headedness and numbness.  Psychiatric/Behavioral: Negative for dysphoric mood and suicidal ideas.    Objective:  BP 125/77   Pulse 84   Ht 5' 3"  (1.6 m)   Wt 251 lb (113.9 kg)   SpO2 95%   BMI 44.46 kg/m   BP/Weight 07/06/2020 10/30/3333 12/28/6254  Systolic BP 389 373 428  Diastolic BP 77 78 68  Wt. (Lbs) 251 254.4 246  BMI 44.46 45.06 43.58      Physical Exam Constitutional:      Appearance: He is well-developed.  Neck:     Vascular:  No JVD.  Cardiovascular:     Rate and Rhythm: Normal rate.     Heart sounds: Normal heart sounds. No murmur heard.   Pulmonary:     Effort: Pulmonary effort is normal.     Breath sounds: Normal breath sounds. No wheezing or rales.  Chest:     Chest wall: No tenderness.  Abdominal:     General: Bowel sounds are normal. There is no distension.     Palpations: Abdomen is soft. There is no mass.     Tenderness: There is no abdominal tenderness.  Musculoskeletal:        General: Normal range of motion.     Right lower leg: No edema.     Left lower leg: No edema.  Neurological:     Mental Status: He is alert and oriented to person, place, and time.  Psychiatric:        Mood and Affect: Mood normal.     CMP Latest Ref Rng & Units 04/29/2020 10/28/2019 08/08/2014  Glucose 65 - 99 mg/dL 174(H) 103(H) 121(H)  BUN 6 - 24 mg/dL 17 10 11   Creatinine 0.76 - 1.27 mg/dL 1.22 1.16 1.12  Sodium 134 - 144 mmol/L 139 142 141  Potassium 3.5 - 5.2 mmol/L 4.3 4.5 4.3  Chloride 96 - 106 mmol/L 99 101 104  CO2 20 - 29 mmol/L 25 26 24   Calcium 8.7 - 10.2 mg/dL 9.5 10.3(H) 9.0  Total Protein 6.0 - 8.5 g/dL 7.3 7.0 7.1  Total Bilirubin 0.0 - 1.2 mg/dL <0.2 <0.2 <0.2(L)  Alkaline Phos 48 - 121 IU/L 58 60 57  AST 0 - 40 IU/L 25 19 20   ALT 0 - 44 IU/L 28 15 21     Lipid Panel     Component Value Date/Time   CHOL 206 (H) 04/29/2020 1623   TRIG 313 (H) 04/29/2020 1623   HDL 56 04/29/2020 1623   CHOLHDL 3.7 04/29/2020 1623   LDLCALC 98 04/29/2020 1623    CBC    Component Value Date/Time   WBC 4.9 08/08/2014 1850   RBC 4.36 08/08/2014 1850   HGB 12.9 (L) 08/08/2014 1850   HCT 38.9 (L) 08/08/2014 1850   PLT 286 08/08/2014 1850   MCV 89.2 08/08/2014 1850   MCH 29.6 08/08/2014 1850   MCHC 33.2 08/08/2014 1850   RDW 13.1 08/08/2014 1850   LYMPHSABS 2.0 08/08/2014 1850   MONOABS 0.4 08/08/2014 1850   EOSABS 0.1 08/08/2014 1850   BASOSABS 0.0 08/08/2014 1850    Lab Results  Component Value  Date   HGBA1C 9.0 (A) 07/06/2020    Assessment & Plan:  1. Type 2 diabetes mellitus with hyperglycemia, without long-term current use of insulin (HCC) Uncontrolled with A1c of 9.0 We will switch from immediate release to extended release Metformin and glipizide to facilitate compliance and he will take this in the evening Discussed role of GLP-1 and  diabetes management but he is not open to an injectable. We will place on [place] on Diabetic diet, my plate method, 165 minutes of moderate intensity exercise/week Blood sugar logs with fasting goals of 80-120 mg/dl, random of less than 180 and in the event of sugars less than 60 mg/dl or greater than 400 mg/dl encouraged to notify the clinic. Advised on the need for annual eye exams, annual foot exams, Pneumonia vaccine. - POCT glucose (manual entry) - POCT glycosylated hemoglobin (Hb A1C) - dapagliflozin propanediol (FARXIGA) 10 MG TABS tablet; Take 1 tablet (10 mg total) by mouth daily before breakfast.  Dispense: 30 tablet; Refill: 6 - glipiZIDE (GLUCOTROL XL) 10 MG 24 hr tablet; Take 1 tablet (10 mg total) by mouth daily with breakfast.  Dispense: 30 tablet; Refill: 6 - atorvastatin (LIPITOR) 40 MG tablet; Take 1 tablet (40 mg total) by mouth daily.  Dispense: 30 tablet; Refill: 6 - Ambulatory referral to Ophthalmology - metFORMIN (GLUCOPHAGE XR) 500 MG 24 hr tablet; Take 2 tablets (1,000 mg total) by mouth daily with breakfast.  Dispense: 60 tablet; Refill: 6  2. Suspected sleep apnea - Split night study; Future  3. Hyperlipidemia associated with type 2 diabetes mellitus ([place]) Uncontrolled from last set of labs in 04/2020 Increase atorvastatin to 40 mg Low-cholesterol diet  4. Class 3 severe obesity due to excess calories with serious comorbidity and body mass index (BMI) of 40.0 to 44.9 in adult [facility]) Reduce portion sizes, increase physical activity   Health Care Maintenance: Declines flu shot Meds ordered this encounter   Medications  . dapagliflozin propanediol (FARXIGA) 10 MG TABS tablet    Sig: Take 1 tablet (10 mg total) by mouth daily before breakfast.    Dispense:  30 tablet    Refill:  6  . DISCONTD: metFORMIN (GLUCOPHAGE XR) 500 MG 24 hr tablet    Sig: Take 1 tablet (500 mg total) by mouth daily with breakfast.    Dispense:  30 tablet    Refill:  6    Discontinue immediate release metfromin  . glipiZIDE (GLUCOTROL XL) 10 MG 24 hr tablet    Sig: Take 1 tablet (10 mg total) by mouth daily with breakfast.    Dispense:  30 tablet    Refill:  6  . atorvastatin (LIPITOR) 40 MG tablet    Sig: Take 1 tablet (40 mg total) by mouth daily.    Dispense:  30 tablet    Refill:  6  . metFORMIN (GLUCOPHAGE XR) 500 MG 24 hr tablet    Sig: Take 2 tablets (1,000 mg total) by mouth daily with breakfast.    Dispense:  60 tablet    Refill:  6    Discontinue immediate release metformin; discontinue previous dose    Follow-up: Return in about 3 months (around 10/06/2020) for Chronic disease management.       Charlott Rakes, MD, FAAFP. [facility] and [place], [place]   07/06/2020, 5:05 PM

## 2020-07-06 NOTE — Progress Notes (Signed)
Wants to get sleep study done.

## 2020-07-07 ENCOUNTER — Other Ambulatory Visit: Payer: Self-pay | Admitting: Family Medicine

## 2020-07-07 ENCOUNTER — Encounter: Payer: Self-pay | Admitting: Family Medicine

## 2020-07-07 DIAGNOSIS — E1165 Type 2 diabetes mellitus with hyperglycemia: Secondary | ICD-10-CM

## 2020-07-07 MED ORDER — GLIPIZIDE ER 10 MG PO TB24: 20.0000 mg | ORAL_TABLET | Freq: Every evening | ORAL | 6 refills | Status: DC

## 2020-07-07 MED ORDER — METFORMIN HCL ER 500 MG PO TB24: 2000.0000 mg | ORAL_TABLET | Freq: Every evening | ORAL | 6 refills | Status: DC

## 2020-08-11 ENCOUNTER — Encounter (INDEPENDENT_AMBULATORY_CARE_PROVIDER_SITE_OTHER): Payer: 59 | Admitting: Ophthalmology

## 2020-08-12 ENCOUNTER — Other Ambulatory Visit: Payer: Self-pay | Admitting: Family Medicine

## 2020-08-25 ENCOUNTER — Telehealth: Payer: Self-pay | Admitting: Family Medicine

## 2020-08-25 NOTE — Telephone Encounter (Signed)
FYI

## 2020-08-25 NOTE — Telephone Encounter (Signed)
Aurther Loft form the [facility] called saying that [facility] denied the sleep study but the pt PCP could call and try to get a pair to pair. Please f/u with the Sleep Center regarding the decision on what the provider wants to do.    [facility] Phone: [phone] Case Number:  [id number]

## 2020-08-25 NOTE — Telephone Encounter (Signed)
Please inform him that his insurance company denied his sleep study.

## 2020-08-26 NOTE — Telephone Encounter (Signed)
Pt was called and informed that insurance has denied the sleep study.

## 2020-09-06 ENCOUNTER — Encounter (INDEPENDENT_AMBULATORY_CARE_PROVIDER_SITE_OTHER): Payer: 59 | Admitting: Ophthalmology

## 2020-09-20 ENCOUNTER — Encounter (INDEPENDENT_AMBULATORY_CARE_PROVIDER_SITE_OTHER): Payer: 59 | Admitting: Ophthalmology

## 2020-09-28 ENCOUNTER — Other Ambulatory Visit: Payer: Self-pay | Admitting: Family Medicine

## 2020-10-05 ENCOUNTER — Other Ambulatory Visit: Payer: Self-pay

## 2020-10-05 ENCOUNTER — Ambulatory Visit: Payer: 59 | Attending: Family Medicine | Admitting: Family Medicine

## 2020-10-05 DIAGNOSIS — R0789 Other chest pain: Secondary | ICD-10-CM

## 2020-10-05 DIAGNOSIS — E1165 Type 2 diabetes mellitus with hyperglycemia: Secondary | ICD-10-CM

## 2020-10-05 MED ORDER — METFORMIN HCL ER 500 MG PO TB24: 2000.0000 mg | ORAL_TABLET | Freq: Every evening | ORAL | 6 refills | Status: DC

## 2020-10-05 MED ORDER — ONETOUCH VERIO VI STRP: ORAL_STRIP | 11 refills | Status: DC

## 2020-10-05 NOTE — Progress Notes (Signed)
Wants to discuss diabetes medication. Blood sugar has been running high 189 this morning.

## 2020-10-05 NOTE — Addendum Note (Signed)
Addended by: Hoy Register on: 10/05/2020 04:32 PM   Modules accepted: Orders

## 2020-10-05 NOTE — Progress Notes (Signed)
Virtual Visit via Telephone Note  I connected with Cory Lewis, on 10/05/2020 at 3:51 PM by telephone due to the COVID-19 pandemic and verified that I am speaking with the correct person using two identifiers.   Consent: I discussed the limitations, risks, security and privacy concerns of performing an evaluation and management service by telephone and the availability of in person appointments. I also discussed with the patient that there may be a patient responsible charge related to this service. The patient expressed understanding and agreed to proceed.   Location of Patient: Work  Biomedical scientist of Provider: Home   Persons participating in Telemedicine visit: Terrill Mohr Farrington-CMA Dr. Margarita Rana     History of Present Illness: Cory Lewis is a 45 year old male with a history of type 2 diabetes mellitus (A1c 9.0 from today) who presents today for a follow-up visit. Blood sugars have been running high and today it was 189 and his random sugar was 275. At his last visit I had placed him on Farxiga but he complained it cost $25 which was too much for him. Metformin dose had then been increased to 2011m XR but he has been taking a total daily dose of 1005mR. 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 He had left sided chest pains about 3-4 days ago on and off. No dyspnea, no diaphoresis. It felt like something grabbing him but pain is absent at this time. Past Medical History:  Diagnosis Date  . Asthma   . Diabetes mellitus without complication ([facility]  No Known Allergies  Current Outpatient Medications on File Prior to Visit  Medication Sig Dispense Refill  . atorvastatin (LIPITOR) 40 MG tablet Take 1 tablet (40 mg total) by mouth daily. 30 tablet 6  . Blood Glucose Monitoring Suppl (ONETOUCH VERIO) w/Device KIT Use as instructed to check blood sugar daily. E11.65 1 kit 0  . dapagliflozin propanediol (FARXIGA) 10 MG TABS tablet Take 1 tablet  (10 mg total) by mouth daily before breakfast. 30 tablet 6  . glipiZIDE (GLUCOTROL XL) 10 MG 24 hr tablet Take 2 tablets (20 mg total) by mouth every evening. 60 tablet 6  . glucose blood (ONETOUCH VERIO) test strip Use as instructed to check blood sugar daily. E11.65 100 each 11  . metFORMIN (GLUCOPHAGE XR) 500 MG 24 hr tablet Take 4 tablets (2,000 mg total) by mouth every evening. 120 tablet 6  . omeprazole (PRILOSEC) 20 MG capsule TAKE 1 CAPSULE BY MOUTH EVERY DAY IN THE MORNING 90 capsule 0  . OneTouch Delica Lancets [id number] Use as instructed to check blood sugar daily. E11.65 100 each 11  . ibuprofen (ADVIL,MOTRIN) 200 MG tablet Take 2 tablets (400 mg total) by mouth every 6 (six) hours as needed for moderate pain. (Patient not taking: No sig reported) 30 tablet 0  . metaxalone (SKELAXIN) 800 MG tablet Take 1 tablet (800 mg total) by mouth 3 (three) times daily. (Patient not taking: No sig reported) 30 tablet 0  . Multiple Vitamin (MULTIVITAMIN WITH MINERALS) TABS tablet Take 1 tablet by mouth daily. (Patient not taking: No sig reported)    . trimethoprim-polymyxin b (POLYTRIM) ophthalmic solution Place 2 drops into the left eye every 4 (four) hours. (Patient not taking: No sig reported) 10 mL 0   No current facility-administered medications on file prior to visit.    Observations/Objective: Awake, alert, oriented x3 Not in acute distress  Lab Results  Component Value Date   HGBA1C  9.0 (A) 07/06/2020    Assessment and Plan: 1. Type 2 diabetes mellitus with hyperglycemia, without long-term current use of insulin (HCC) Uncontrolled with A1c of 9.0 Advised on correct dosing of Metformin Continue Glipizide Will review blood sugar log at next visit and consider restarting Farxiga if blood sugars are still elevated Counseled on Diabetic diet, my plate method, 485 minutes of moderate intensity exercise/week Blood sugar logs with fasting goals of 80-120 mg/dl, random of less than 180 and  in the event of sugars less than 60 mg/dl or greater than 400 mg/dl encouraged to notify the clinic. Advised on the need for annual eye exams, annual foot exams, Pneumonia vaccine. - metFORMIN (GLUCOPHAGE XR) 500 MG 24 hr tablet; Take 4 tablets (2,000 mg total) by mouth every evening.  Dispense: 120 tablet; Refill: 6  2. Other chest pain Absent at this time. Advised to present to the ED with any chest pain; he is at a higher risk given history of Diabetes   Follow Up Instructions: 3 weeks -  Diabetes   I discussed the assessment and treatment plan with the patient. The patient was provided an opportunity to ask questions and all were answered. The patient agreed with the plan and demonstrated an understanding of the instructions.   The patient was advised to call back or seek an in-person evaluation if the symptoms worsen or if the condition fails to improve as anticipated.     I provided 13 minutes total of non-face-to-face time during this encounter including median intraservice time, reviewing previous notes, investigations, ordering medications, medical decision making, coordinating care and patient verbalized understanding at the end of the visit.     Charlott Rakes, MD, FAAFP. [facility] and [place], [place]   10/05/2020, 3:51 PM

## 2020-10-06 ENCOUNTER — Encounter (INDEPENDENT_AMBULATORY_CARE_PROVIDER_SITE_OTHER): Payer: 59 | Admitting: Ophthalmology

## 2020-10-27 ENCOUNTER — Encounter (INDEPENDENT_AMBULATORY_CARE_PROVIDER_SITE_OTHER): Payer: 59 | Admitting: Ophthalmology

## 2020-11-21 ENCOUNTER — Encounter (INDEPENDENT_AMBULATORY_CARE_PROVIDER_SITE_OTHER): Payer: 59 | Admitting: Ophthalmology

## 2020-12-20 ENCOUNTER — Other Ambulatory Visit: Payer: Self-pay | Admitting: Family Medicine

## 2020-12-22 ENCOUNTER — Encounter (INDEPENDENT_AMBULATORY_CARE_PROVIDER_SITE_OTHER): Payer: 59 | Admitting: Ophthalmology

## 2021-01-10 ENCOUNTER — Encounter (INDEPENDENT_AMBULATORY_CARE_PROVIDER_SITE_OTHER): Payer: 59 | Admitting: Ophthalmology

## 2021-02-09 DIAGNOSIS — U071 COVID-19: Secondary | ICD-10-CM | POA: Diagnosis not present

## 2021-02-09 DIAGNOSIS — R0781 Pleurodynia: Secondary | ICD-10-CM | POA: Diagnosis not present

## 2021-02-09 DIAGNOSIS — I10 Essential (primary) hypertension: Secondary | ICD-10-CM | POA: Diagnosis not present

## 2021-02-13 ENCOUNTER — Other Ambulatory Visit: Payer: Self-pay | Admitting: Family Medicine

## 2021-02-13 DIAGNOSIS — E1165 Type 2 diabetes mellitus with hyperglycemia: Secondary | ICD-10-CM

## 2021-02-14 MED ORDER — ONETOUCH VERIO W/DEVICE KIT: PACK | 0 refills | Status: DC

## 2021-02-14 MED ORDER — OMEPRAZOLE 20 MG PO CPDR
20.0000 mg | DELAYED_RELEASE_CAPSULE | Freq: Every day | ORAL | 0 refills | Status: DC
Start: 2021-02-14 — End: 2021-08-29

## 2021-05-16 ENCOUNTER — Other Ambulatory Visit: Payer: Self-pay | Admitting: Family Medicine

## 2021-05-16 DIAGNOSIS — E1165 Type 2 diabetes mellitus with hyperglycemia: Secondary | ICD-10-CM

## 2021-05-16 NOTE — Telephone Encounter (Signed)
Requested medications are due for refill today yes  Requested medications are on the active medication list yes  Last refill 02/16/21  Last visit 09/2020, last lab 06/2020  Future visit scheduled No  Notes to clinic Failed protocol due to no valid visit within 6  months, lab more than 65 days old, no upcoming visit scheduled.

## 2021-06-26 ENCOUNTER — Ambulatory Visit: Payer: 59 | Admitting: Family Medicine

## 2021-06-29 ENCOUNTER — Other Ambulatory Visit: Payer: Self-pay | Admitting: Family Medicine

## 2021-06-29 DIAGNOSIS — E1165 Type 2 diabetes mellitus with hyperglycemia: Secondary | ICD-10-CM

## 2021-06-29 NOTE — Telephone Encounter (Signed)
Requested medications are due for refill today.  yes  Requested medications are on the active medications list.  yes  Last refill. 07/07/2020  Future visit scheduled.   no  Notes to clinic.  Patient is more than 3 months overdue for OV.

## 2021-07-15 ENCOUNTER — Other Ambulatory Visit: Payer: Self-pay | Admitting: Family Medicine

## 2021-07-15 DIAGNOSIS — E1165 Type 2 diabetes mellitus with hyperglycemia: Secondary | ICD-10-CM

## 2021-07-16 NOTE — Telephone Encounter (Signed)
Requested medication (s) are due for refill today: yes   Requested medication (s) are on the active medication list: yes  Last refill:  atorvastatin: 07/06/20 #30 6 RF             glipizide: 07/07/20 #60 6 RF  Future visit scheduled: yes  Notes to clinic:  overdue lab work, appt.    Requested Prescriptions  Pending Prescriptions Disp Refills   atorvastatin (LIPITOR) 20 MG tablet [Pharmacy Med Name: ATORVASTATIN 20 MG TABLET] 90 tablet 2    Sig: TAKE 1 TABLET BY MOUTH EVERY DAY     Cardiovascular:  Antilipid - Statins Failed - 07/15/2021  3:33 PM      Failed - Total Cholesterol in normal range and within 360 days    Cholesterol, Total  Date Value Ref Range Status  04/29/2020 206 (H) 100 - 199 mg/dL Final          Failed - LDL in normal range and within 360 days    LDL Chol Calc (NIH)  Date Value Ref Range Status  04/29/2020 98 0 - 99 mg/dL Final          Failed - HDL in normal range and within 360 days    HDL  Date Value Ref Range Status  04/29/2020 56 >39 mg/dL Final          Failed - Triglycerides in normal range and within 360 days    Triglycerides  Date Value Ref Range Status  04/29/2020 313 (H) 0 - 149 mg/dL Final          Passed - Patient is not pregnant      Passed - Valid encounter within last 12 months    Recent Outpatient Visits           9 months ago Other chest pain   [facility] [place], [place], MD   1 year ago Type 2 diabetes mellitus with hyperglycemia, without long-term current use of insulin (HCC)   [place] [facility] [place], [place], MD   1 year ago Type 2 diabetes mellitus with hyperglycemia, without long-term current use of insulin (HCC)   [place] [facility] [employer], Odette Horns, MD   1 year ago Screening for viral disease   [place] [facility] [place], [place], MD       Future Appointments             In 1 month Hoy Register, MD [facility]             glipiZIDE (GLUCOTROL XL) 10 MG 24 hr tablet [Pharmacy Med Name: GLIPIZIDE ER 10 MG TABLET] 180 tablet 2    Sig: Take 2 tablets (20 mg total) by mouth every evening.     Endocrinology:  Diabetes - Sulfonylureas Failed - 07/15/2021  3:33 PM      Failed - HBA1C is between 0 and 7.9 and within 180 days    HbA1c, POC (controlled diabetic range)  Date Value Ref Range Status  07/06/2020 9.0 (A) 0.0 - 7.0 % Final          Failed - Valid encounter within last 6 months    Recent Outpatient Visits           9 months ago Other chest pain   [facility] [place], Odette Horns, MD   1 year ago Type 2 diabetes mellitus with hyperglycemia, without long-term current use  of insulin (HCC)   [facility] [place], [place], MD   1 year ago Type 2 diabetes mellitus with hyperglycemia, without long-term current use of insulin [facility])   [place] [facility] Hoy Register, MD   1 year ago Screening for viral disease   [place] [facility] Hoy Register, MD       Future Appointments             In 1 month Hoy Register, MD [facility]

## 2021-08-29 ENCOUNTER — Other Ambulatory Visit: Payer: Self-pay | Admitting: Family Medicine

## 2021-08-29 DIAGNOSIS — E1165 Type 2 diabetes mellitus with hyperglycemia: Secondary | ICD-10-CM

## 2021-08-29 NOTE — Telephone Encounter (Signed)
Requested medication (s) are due for refill today: yes  Requested medication (s) are on the active medication list: yes  Last refill:  06/03/21  Future visit scheduled: 09/11/21  Notes to clinic:Failed protocol of labs within 360 days, no visit within 12 months, has upcoming appt, please assess.  Requested Prescriptions  Pending Prescriptions Disp Refills   atorvastatin (LIPITOR) 40 MG tablet [Pharmacy Med Name: ATORVASTATIN 40 MG TABLET] 90 tablet 2    Sig: TAKE 1 TABLET BY MOUTH EVERY DAY     Cardiovascular:  Antilipid - Statins Failed - 08/29/2021  1:35 AM      Failed - Total Cholesterol in normal range and within 360 days    Cholesterol, Total  Date Value Ref Range Status  04/29/2020 206 (H) 100 - 199 mg/dL Final          Failed - LDL in normal range and within 360 days    LDL Chol Calc (NIH)  Date Value Ref Range Status  04/29/2020 98 0 - 99 mg/dL Final          Failed - HDL in normal range and within 360 days    HDL  Date Value Ref Range Status  04/29/2020 56 >39 mg/dL Final          Failed - Triglycerides in normal range and within 360 days    Triglycerides  Date Value Ref Range Status  04/29/2020 313 (H) 0 - 149 mg/dL Final          Passed - Patient is not pregnant      Passed - Valid encounter within last 12 months    Recent Outpatient Visits           10 months ago Other chest pain   [place] [facility] [place], Damar, MD   1 year ago Type 2 diabetes mellitus with hyperglycemia, without long-term current use of insulin (HCC)   [place] [facility] [place], [place], MD   1 year ago Type 2 diabetes mellitus with hyperglycemia, without long-term current use of insulin (HCC)   [place] [facility] [place], Odette Horns, MD   1 year ago Screening for viral disease   [place] [facility] [place], Odette Horns, MD       Future Appointments             In 1 week Hoy Register,  MD [facility]            Signed Prescriptions Disp Refills   omeprazole (PRILOSEC) 20 MG capsule 90 capsule 0    Sig: TAKE 1 CAPSULE BY MOUTH EVERY DAY     Gastroenterology: Proton Pump Inhibitors Passed - 08/29/2021  1:35 AM      Passed - Valid encounter within last 12 months    Recent Outpatient Visits           10 months ago Other chest pain   [place] [facility] [place], [place], MD   1 year ago Type 2 diabetes mellitus with hyperglycemia, without long-term current use of insulin (HCC)   [place] [facility] [place], [place], MD   1 year ago Type 2 diabetes mellitus with hyperglycemia, without long-term current use of insulin (HCC)   [place] [facility] Hoy Register, MD   1 year ago Screening for viral disease   [place] [facility] Hoy Register, MD  Future Appointments             In 1 week Hoy Register, MD [facility]

## 2021-08-29 NOTE — Telephone Encounter (Signed)
Requested Prescriptions  Pending Prescriptions Disp Refills  . atorvastatin (LIPITOR) 40 MG tablet [Pharmacy Med Name: ATORVASTATIN 40 MG TABLET] 90 tablet 2    Sig: TAKE 1 TABLET BY MOUTH EVERY DAY     Cardiovascular:  Antilipid - Statins Failed - 08/29/2021  1:35 AM      Failed - Total Cholesterol in normal range and within 360 days    Cholesterol, Total  Date Value Ref Range Status  04/29/2020 206 (H) 100 - 199 mg/dL Final         Failed - LDL in normal range and within 360 days    LDL Chol Calc (NIH)  Date Value Ref Range Status  04/29/2020 98 0 - 99 mg/dL Final         Failed - HDL in normal range and within 360 days    HDL  Date Value Ref Range Status  04/29/2020 56 >39 mg/dL Final         Failed - Triglycerides in normal range and within 360 days    Triglycerides  Date Value Ref Range Status  04/29/2020 313 (H) 0 - 149 mg/dL Final         Passed - Patient is not pregnant      Passed - Valid encounter within last 12 months    Recent Outpatient Visits          10 months ago Other chest pain   [facility] [place], [place], MD   1 year ago Type 2 diabetes mellitus with hyperglycemia, without long-term current use of insulin (HCC)   [facility] [place], [place], MD   1 year ago Type 2 diabetes mellitus with hyperglycemia, without long-term current use of insulin (HCC)   [facility] Hoy Register, MD   1 year ago Screening for viral disease   [facility] [place], Odette Horns, MD      Future Appointments            In 1 week Hoy Register, MD [facility]           . omeprazole (PRILOSEC) 20 MG capsule [Pharmacy Med Name: OMEPRAZOLE DR 20 MG CAPSULE] 90 capsule 0    Sig: TAKE 1 CAPSULE BY MOUTH EVERY DAY     Gastroenterology: Proton Pump Inhibitors Passed - 08/29/2021  1:35 AM      Passed - Valid encounter within  last 12 months    Recent Outpatient Visits          10 months ago Other chest pain   [facility] [place], [place], MD   1 year ago Type 2 diabetes mellitus with hyperglycemia, without long-term current use of insulin (HCC)   [facility] [place], [place], MD   1 year ago Type 2 diabetes mellitus with hyperglycemia, without long-term current use of insulin (HCC)   [facility] Hoy Register, MD   1 year ago Screening for viral disease   [facility] Hoy Register, MD      Future Appointments            In 1 week Hoy Register, MD [facility]

## 2021-09-05 ENCOUNTER — Telehealth (INDEPENDENT_AMBULATORY_CARE_PROVIDER_SITE_OTHER): Payer: Self-pay | Admitting: Family Medicine

## 2021-09-05 NOTE — Telephone Encounter (Signed)
Dr. Alvis Lemmings on vacation 12/19. No answer and unable to leave vm for patient to call [phone] to reschedule appt.

## 2021-09-11 ENCOUNTER — Ambulatory Visit: Payer: Medicaid Other | Admitting: Family Medicine

## 2021-09-24 DIAGNOSIS — Z419 Encounter for procedure for purposes other than remedying health state, unspecified: Secondary | ICD-10-CM | POA: Diagnosis not present

## 2021-10-09 ENCOUNTER — Other Ambulatory Visit: Payer: Self-pay | Admitting: Family Medicine

## 2021-10-09 DIAGNOSIS — E1165 Type 2 diabetes mellitus with hyperglycemia: Secondary | ICD-10-CM

## 2021-10-09 NOTE — Telephone Encounter (Signed)
Requested medication (s) are due for refill today: yes  Requested medication (s) are on the active medication list: yes  Last refill:  07/17/21 #180/0  Future visit scheduled: yes  Notes to clinic:  Unable to refill per protocol due to failed labs, no updated results.      Requested Prescriptions  Pending Prescriptions Disp Refills   glipiZIDE (GLUCOTROL XL) 10 MG 24 hr tablet [Pharmacy Med Name: GLIPIZIDE ER 10 MG TABLET] 180 tablet 0    Sig: TAKE 2 TABLETS (20 MG TOTAL) BY MOUTH EVERY EVENING.     Endocrinology:  Diabetes - Sulfonylureas Failed - 10/09/2021  2:30 PM      Failed - HBA1C is between 0 and 7.9 and within 180 days    HbA1c, POC (controlled diabetic range)  Date Value Ref Range Status  07/06/2020 9.0 (A) 0.0 - 7.0 % Final          Failed - Valid encounter within last 6 months    Recent Outpatient Visits           1 year ago Other chest pain   [place], [place], MD   1 year ago Type 2 diabetes mellitus with hyperglycemia, without long-term current use of insulin ([place])   [place], Charlane Ferretti, MD   1 year ago Type 2 diabetes mellitus with hyperglycemia, without long-term current use of insulin ([place])   [place], [place], MD   1 year ago Screening for viral disease   [place], MD       Future Appointments             In 1 month Charlott Rakes, MD [place]

## 2021-10-25 ENCOUNTER — Other Ambulatory Visit: Payer: Self-pay | Admitting: Family Medicine

## 2021-10-25 DIAGNOSIS — Z419 Encounter for procedure for purposes other than remedying health state, unspecified: Secondary | ICD-10-CM | POA: Diagnosis not present

## 2021-10-25 DIAGNOSIS — E1165 Type 2 diabetes mellitus with hyperglycemia: Secondary | ICD-10-CM

## 2021-10-26 NOTE — Telephone Encounter (Signed)
Requested medication (s) are due for refill today: Yes  Requested medication (s) are on the active medication list: Yes  Last refill:  10/11/21  Future visit scheduled: Yes  Notes to clinic:  Protocol indicates pt. Needs lab work.    Requested Prescriptions  Pending Prescriptions Disp Refills   glipiZIDE (GLUCOTROL XL) 10 MG 24 hr tablet [Pharmacy Med Name: GLIPIZIDE ER 10 MG TABLET] 180 tablet 1    Sig: TAKE 2 TABLETS BY MOUTH EVERY EVENING.     Endocrinology:  Diabetes - Sulfonylureas Failed - 10/25/2021 11:36 AM      Failed - HBA1C is between 0 and 7.9 and within 180 days    HbA1c, POC (controlled diabetic range)  Date Value Ref Range Status  07/06/2020 9.0 (A) 0.0 - 7.0 % Final          Failed - Cr in normal range and within 360 days    Creatinine, Ser  Date Value Ref Range Status  04/29/2020 1.22 0.76 - 1.27 mg/dL Final          Failed - Valid encounter within last 6 months    Recent Outpatient Visits           1 year ago Other chest pain   [place], [place], MD   1 year ago Type 2 diabetes mellitus with hyperglycemia, without long-term current use of insulin ([place])   [place], [place], MD   1 year ago Type 2 diabetes mellitus with hyperglycemia, without long-term current use of insulin ([place])   [place], [place], MD   1 year ago Screening for viral disease   [place], [place], MD       Future Appointments             In 2 weeks Charlott Rakes, MD [place]

## 2021-11-13 ENCOUNTER — Ambulatory Visit: Payer: Medicaid Other | Admitting: Family Medicine

## 2021-11-22 DIAGNOSIS — Z419 Encounter for procedure for purposes other than remedying health state, unspecified: Secondary | ICD-10-CM | POA: Diagnosis not present

## 2021-12-23 DIAGNOSIS — Z419 Encounter for procedure for purposes other than remedying health state, unspecified: Secondary | ICD-10-CM | POA: Diagnosis not present

## 2022-01-22 DIAGNOSIS — Z419 Encounter for procedure for purposes other than remedying health state, unspecified: Secondary | ICD-10-CM | POA: Diagnosis not present

## 2022-02-06 ENCOUNTER — Ambulatory Visit: Payer: Commercial Managed Care - HMO | Attending: Family Medicine | Admitting: Family Medicine

## 2022-02-06 ENCOUNTER — Telehealth: Payer: Self-pay | Admitting: Family Medicine

## 2022-02-06 ENCOUNTER — Encounter: Payer: Self-pay | Admitting: Family Medicine

## 2022-02-06 VITALS — BP 122/77 | HR 88 | Ht 63.0 in | Wt 236.8 lb

## 2022-02-06 DIAGNOSIS — R29818 Other symptoms and signs involving the nervous system: Secondary | ICD-10-CM

## 2022-02-06 DIAGNOSIS — E785 Hyperlipidemia, unspecified: Secondary | ICD-10-CM

## 2022-02-06 DIAGNOSIS — Z1159 Encounter for screening for other viral diseases: Secondary | ICD-10-CM | POA: Diagnosis not present

## 2022-02-06 DIAGNOSIS — E1169 Type 2 diabetes mellitus with other specified complication: Secondary | ICD-10-CM | POA: Diagnosis not present

## 2022-02-06 DIAGNOSIS — E1165 Type 2 diabetes mellitus with hyperglycemia: Secondary | ICD-10-CM

## 2022-02-06 DIAGNOSIS — Z1211 Encounter for screening for malignant neoplasm of colon: Secondary | ICD-10-CM

## 2022-02-06 DIAGNOSIS — Z6841 Body Mass Index (BMI) 40.0 and over, adult: Secondary | ICD-10-CM

## 2022-02-06 LAB — GLUCOSE, POCT (MANUAL RESULT ENTRY): POC Glucose: 326 mg/dl — AB (ref 70–99)

## 2022-02-06 LAB — POCT GLYCOSYLATED HEMOGLOBIN (HGB A1C): HbA1c, POC (controlled diabetic range): 9.3 % — AB (ref 0.0–7.0)

## 2022-02-06 MED ORDER — METFORMIN HCL ER 500 MG PO TB24: 2000.0000 mg | ORAL_TABLET | Freq: Every evening | ORAL | 6 refills | Status: DC

## 2022-02-06 MED ORDER — ATORVASTATIN CALCIUM 40 MG PO TABS: 40.0000 mg | ORAL_TABLET | Freq: Every day | ORAL | 6 refills | Status: DC

## 2022-02-06 MED ORDER — GLIPIZIDE ER 10 MG PO TB24: 20.0000 mg | ORAL_TABLET | Freq: Every evening | ORAL | 6 refills | Status: DC

## 2022-02-06 MED ORDER — DAPAGLIFLOZIN PROPANEDIOL 10 MG PO TABS: 10.0000 mg | ORAL_TABLET | Freq: Every day | ORAL | 6 refills | Status: DC

## 2022-02-06 MED ORDER — DEXCOM G6 RECEIVER DEVI: 0 refills | Status: DC

## 2022-02-06 MED ORDER — ATORVASTATIN CALCIUM 20 MG PO TABS: 20.0000 mg | ORAL_TABLET | Freq: Every day | ORAL | 6 refills | Status: DC

## 2022-02-06 MED ORDER — DEXCOM G6 TRANSMITTER MISC: 1 refills | Status: DC

## 2022-02-06 MED ORDER — DEXCOM G6 SENSOR MISC: 2 refills | Status: DC

## 2022-02-06 NOTE — Telephone Encounter (Signed)
Can you please assist in sending prescription for CGM to his pharmacy?  Thank you ?

## 2022-02-06 NOTE — Telephone Encounter (Signed)
Yes ma'am, rxs sent! ?

## 2022-02-06 NOTE — Progress Notes (Signed)
Referral to eye doctor ?Sleep study ?Discuss new meter. ?

## 2022-02-06 NOTE — Addendum Note (Signed)
Addended by: Lois Huxley, Jeannett Senior L on: 02/06/2022 03:52 PM ? ? Modules accepted: Orders ? ?

## 2022-02-06 NOTE — Progress Notes (Signed)
? ?Subjective:  ?Patient ID: Cory Lewis, male    DOB: September 10, 1976  Age: 46 y.o. MRN: [id number] ? ?CC: Diabetes ? ? ?HPI ?Cory Lewis is a 46 y.o. year old male with a history of type 2 diabetes mellitus (A1c 9.3 from today) who presents today for a follow-up visit. ?His last visit was 15 months ago as a telehealth visit. ? ?Interval History: ?His wife complains that he stops breathing in his sleep and snores. He has no headaches but has daytime somnolence and fatigue. ? ?He would like a CGM. ?He does not check his sugars at home as as he has no glucometer. ?States he is slowly  running out of his medications and has been stretching his medications . States he "is looking for natural remedies to treat his Diabetes". " I don't like the side effect of medications like metformin which I read can shut down the kidneys". ?For his diabetes he is on metformin, glipizide and Farxiga.  We have discussed an injectable in the past but he has been resistant to this. ?Requests referral to ophthalmology. ? ?He is also on a statin and the dose was increased from 20 mg to 40 mg after his last set of labs are reviewed elevated lipid levels. ?Past Medical History:  ?Diagnosis Date  ? Asthma   ? Diabetes mellitus without complication ([place])   ? ? ?Past Surgical History:  ?Procedure Laterality Date  ? NO PAST SURGERIES    ? ? ?Family History  ?Problem Relation Age of Onset  ? Diabetes Mother   ? Diabetes Father   ? ? ?Social History  ? ?Socioeconomic History  ? Marital status: Divorced  ?  Spouse name: Not on file  ? Number of children: Not on file  ? Years of education: Not on file  ? Highest education level: Not on file  ?Occupational History  ? Not on file  ?Tobacco Use  ? Smoking status: Never  ? Smokeless tobacco: Never  ?Substance and Sexual Activity  ? Alcohol use: No  ? Drug use: No  ? Sexual activity: Not Currently  ?Other Topics Concern  ? Not on file  ?Social History Narrative  ? Not on file  ? ?Social Determinants of Health   ? ?Financial Resource Strain: Not on file  ?Food Insecurity: Not on file  ?Transportation Needs: Not on file  ?Physical Activity: Not on file  ?Stress: Not on file  ?Social Connections: Not on file  ? ? ?No Known Allergies ? ?Outpatient Medications Prior to Visit  ?Medication Sig Dispense Refill  ? Blood Glucose Monitoring Suppl (ONETOUCH VERIO) w/Device KIT Use as instructed to check blood sugar daily. E11.65 1 kit 0  ? glucose blood (ONETOUCH VERIO) test strip Use as instructed to check blood sugar daily. E11.65 100 each 11  ? ibuprofen (ADVIL,MOTRIN) 200 MG tablet Take 2 tablets (400 mg total) by mouth every 6 (six) hours as needed for moderate pain. 30 tablet 0  ? metaxalone (SKELAXIN) 800 MG tablet Take 1 tablet (800 mg total) by mouth 3 (three) times daily. 30 tablet 0  ? Multiple Vitamin (MULTIVITAMIN WITH MINERALS) TABS tablet Take 1 tablet by mouth daily.    ? omeprazole (PRILOSEC) 20 MG capsule TAKE 1 CAPSULE BY MOUTH EVERY DAY 90 capsule 0  ? OneTouch Delica Lancets 08X MISC Use as instructed to check blood sugar daily. E11.65 100 each 11  ? trimethoprim-polymyxin b (POLYTRIM) ophthalmic solution Place 2 drops into the left eye every 4 (four) hours.  10 mL 0  ? atorvastatin (LIPITOR) 20 MG tablet TAKE 1 TABLET BY MOUTH EVERY DAY 90 tablet 0  ? atorvastatin (LIPITOR) 40 MG tablet Take 1 tablet (40 mg total) by mouth daily. 30 tablet 6  ? dapagliflozin propanediol (FARXIGA) 10 MG TABS tablet Take 1 tablet (10 mg total) by mouth daily before breakfast. 30 tablet 6  ? glipiZIDE (GLUCOTROL XL) 10 MG 24 hr tablet TAKE 2 TABLETS (20 MG TOTAL) BY MOUTH EVERY EVENING. 60 tablet 0  ? metFORMIN (GLUCOPHAGE XR) 500 MG 24 hr tablet Take 4 tablets (2,000 mg total) by mouth every evening. 120 tablet 6  ? ?No facility-administered medications prior to visit.  ? ? ? ?ROS ?Review of Systems  ?Constitutional:  Negative for activity change and appetite change.  ?HENT:  Negative for sinus pressure and sore throat.   ?Eyes:   Negative for visual disturbance.  ?Respiratory:  Negative for cough, chest tightness and shortness of breath.   ?Cardiovascular:  Negative for chest pain and leg swelling.  ?Gastrointestinal:  Negative for abdominal distention, abdominal pain, constipation and diarrhea.  ?Endocrine: Negative.   ?Genitourinary:  Negative for dysuria.  ?Musculoskeletal:  Negative for joint swelling and myalgias.  ?Skin:  Negative for rash.  ?Allergic/Immunologic: Negative.   ?Neurological:  Negative for weakness, light-headedness and numbness.  ?Psychiatric/Behavioral:  Negative for dysphoric mood and suicidal ideas.   ? ?Objective:  ?BP 122/77   Pulse 88   Ht 5' 3"  (1.6 m)   Wt 236 lb 12.8 oz (107.4 kg)   SpO2 96%   BMI 41.95 kg/m?  ? ? ?  02/06/2022  ?  2:40 PM 07/06/2020  ?  3:55 PM 01/27/2020  ?  3:50 PM  ?BP/Weight  ?Systolic BP [phone]  ?Diastolic BP 77 77 78  ?Wt. (Lbs) 236.8 251 254.4  ?BMI 41.95 kg/m2 44.46 kg/m2 45.06 kg/m2  ? ? ? ? ?Physical Exam ?Constitutional:   ?   Appearance: He is well-developed. He is obese.  ?Cardiovascular:  ?   Rate and Rhythm: Normal rate.  ?   Heart sounds: Normal heart sounds. No murmur heard. ?Pulmonary:  ?   Effort: Pulmonary effort is normal.  ?   Breath sounds: Normal breath sounds. No wheezing or rales.  ?Chest:  ?   Chest wall: No tenderness.  ?Abdominal:  ?   General: Bowel sounds are normal. There is no distension.  ?   Palpations: Abdomen is soft. There is no mass.  ?   Tenderness: There is no abdominal tenderness.  ?Musculoskeletal:     ?   General: Normal range of motion.  ?   Right lower leg: No edema.  ?   Left lower leg: No edema.  ?Neurological:  ?   Mental Status: He is alert and oriented to person, place, and time.  ?Psychiatric:     ?   Mood and Affect: Mood normal.  ? ? ? ?  Latest Ref Rng & Units 04/29/2020  ?  4:23 PM 10/28/2019  ?  4:15 PM 08/08/2014  ?  6:50 PM  ?CMP  ?Glucose 65 - 99 mg/dL 174   103   121    ?BUN 6 - 24 mg/dL 17   10   11     ?Creatinine 0.76 - 1.27  mg/dL 1.22   1.16   1.12    ?Sodium 134 - 144 mmol/L 139   142   141    ?Potassium 3.5 - 5.2 mmol/L 4.3  4.5   4.3    ?Chloride 96 - 106 mmol/L 99   101   104    ?CO2 20 - 29 mmol/L 25   26   24     ?Calcium 8.7 - 10.2 mg/dL 9.5   10.3   9.0    ?Total Protein 6.0 - 8.5 g/dL 7.3   7.0   7.1    ?Total Bilirubin 0.0 - 1.2 mg/dL <0.2   <0.2   <0.2    ?Alkaline Phos 48 - 121 IU/L 58   60   57    ?AST 0 - 40 IU/L 25   19   20     ?ALT 0 - 44 IU/L 28   15   21     ? ? ?Lipid Panel  ?   ?Component Value Date/Time  ? CHOL 206 (H) 04/29/2020 1623  ? TRIG 313 (H) 04/29/2020 1623  ? HDL 56 04/29/2020 1623  ? CHOLHDL 3.7 04/29/2020 1623  ? [place] 98 04/29/2020 1623  ? ? ?CBC ?   ?Component Value Date/Time  ? WBC 4.9 08/08/2014 1850  ? RBC 4.36 08/08/2014 1850  ? HGB 12.9 (L) 08/08/2014 1850  ? HCT 38.9 (L) 08/08/2014 1850  ? PLT 286 08/08/2014 1850  ? MCV 89.2 08/08/2014 1850  ? MCH 29.6 08/08/2014 1850  ? MCHC 33.2 08/08/2014 1850  ? RDW 13.1 08/08/2014 1850  ? LYMPHSABS 2.0 08/08/2014 1850  ? MONOABS 0.4 08/08/2014 1850  ? EOSABS 0.1 08/08/2014 1850  ? BASOSABS 0.0 08/08/2014 1850  ? ? ?Lab Results  ?Component Value Date  ? HGBA1C 9.3 (A) 02/06/2022  ? ? ?The 10-year ASCVD risk score (Arnett DK, et al., 2019) is: 6.9% ?  Values used to calculate the score: ?    Age: 46 years ?    Sex: Male ?    Is Non-Hispanic African American: Yes ?    Diabetic: Yes ?    Tobacco smoker: No ?    Systolic Blood Pressure: 474 mmHg ?    Is BP treated: No ?    HDL Cholesterol: 56 mg/dL ?    Total Cholesterol: 206 mg/dL ? ?Assessment & Plan:  ?1. Type 2 diabetes mellitus with hyperglycemia, without long-term current use of insulin (HCC) ?Uncontrolled with A1c of 9.3; goal is less than 7.0 ?Medication nonadherence also contributing ?Advised that his current regimen is insufficient to control his diabetes mellitus ?Discussed the option of adding GLP-1 RA  including cardiovascular and weight loss benefits but he declines; he is also not open to any  injectable insulin. ?He is looking for natural remedies to control his diabetes and I have warned him against this as this is likely to result in a worsening of his diabetes if he discontinues his medications ?C

## 2022-02-06 NOTE — Patient Instructions (Signed)
Preventing Diabetes Mellitus Complications ?You can help to prevent or slow down problems that are caused by diabetes (diabetes mellitus). Following your diabetes plan and taking care of yourself can reduce your risk of serious or life-threatening complications. ?What actions can I take to prevent diabetes complications? ?Diabetes management ? ?Follow instructions from your health care providers about managing your diabetes. Your diabetes may be managed by a team of health care providers who can teach you how to care for yourself and can answer questions that you have. ?Educate yourself about your condition so you can make healthy choices about eating and physical activity. ?Know your target range for your blood sugar (glucose), and check your blood glucose level as often as told. Your health care provider will help you decide how often to check your blood glucose level depending on your treatment goals and how well you are meeting them. ?Ask your health care provider if you should take low-dose aspirin daily and what dose is recommended for you. Taking low-dose aspirin daily is recommended to help prevent cardiovascular disease. ?Controlling your blood pressure and cholesterol ?Your personal target blood pressure is determined based on: ?Your age. ?Your medicines. ?How long you have had diabetes. ?Any other medical conditions you have. ?To control your blood pressure: ?Follow instructions from your health care provider about meal planning, exercise, and medicines. ?Make sure your health care provider checks your blood pressure at every medical visit. ?Monitor your blood pressure at home as told by your health care provider. ?To control your cholesterol: ?Follow instructions from your health care provider about meal planning, exercise, and medicines. ?Have your cholesterol checked at least once a year. ?You may be prescribed medicine to lower cholesterol (statin). If you are not taking a statin, ask your health care  provider if you should be. ?Controlling your cholesterol may: ?Help prevent heart disease and stroke. These are the most common health problems for people with diabetes. ?Improve your blood flow. ? ?Medical appointments and vaccines ?Schedule and keep yearly physical exams and eye exams. Your health care provider will tell you how often you need medical visits depending on your diabetes management plan. Keep all follow-up visits as told. This is important so possible problems can be identified early and complications can be avoided or treated. ?Every visit with your health care provider should include measuring your: ?Weight. ?Blood pressure. ?Blood glucose control. ?Your A1C (hemoglobin A1C) level should be checked: ?At least 2 times a year, if you are meeting your treatment goals. ?4 times a year, if you are not meeting treatment goals or if your treatment goals have changed. ?Your blood lipids (lipid profile) should be checked yearly. You should also be checked yearly for protein in your urine (urine microalbumin). ?If you have type 1 diabetes, get an eye exam 3-5 years after you are diagnosed, and then once a year after your first exam. ?If you have type 2 diabetes, get an eye exam as soon as you are diagnosed, and then once a year after your first exam. ?It is also important to keep your vaccines current. It is recommended that you receive: ?A flu (influenza) vaccine every year. ?A pneumonia (pneumococcal) vaccine and a hepatitis B vaccine. If you are age 46 or older, you may get the pneumonia vaccine as a series of two separate shots. ?Ask your health care provider which other vaccines may be recommended. ?Lifestyle ?Do not use any products that contain nicotine or tobacco, such as cigarettes, e-cigarettes, and chewing tobacco. If you  need help quitting, ask your health care provider. By avoiding nicotine and tobacco: ?You will lower your risk for heart attack, stroke, nerve disease, and kidney disease. ?Your  cholesterol and blood pressure may improve. ?Your blood circulation will improve. ?If you drink alcohol: ?Limit how much you use to: ?0-1 drink a day for women who are not pregnant. ?0-2 drinks a day for men. ?Be aware of how much alcohol is in your drink. In the [place] one drink equals one 12 oz bottle of beer (355 mL), one 5 oz glass of wine (148 mL), or one 11?2 oz glass of hard liquor (44 mL). ?Taking care of your feet ?Diabetes may cause you to have poor blood circulation to your legs and feet. Because of this, taking care of your feet is very important. Diabetes can cause: ?The skin on the feet to get thinner, break more easily, and heal more slowly. ?Nerve damage in your legs and feet, which results in decreased feeling. You may not notice minor injuries that could lead to serious problems. ?To avoid foot problems: ?Check your skin and feet every day for cuts, bruises, redness, blisters, or sores. ?Schedule a foot exam with your health care provider once every year. This exam includes: ?Inspecting the structure and skin of your feet. ?Checking the pulses and sensation in your feet. ?Make sure that your health care provider performs a visual foot exam at every medical visit. ? ?Taking care of your teeth ?People with poorly controlled diabetes are more likely to have gum (periodontal) disease. Diabetes can make periodontal diseases harder to control. If not treated, periodontal diseases can lead to tooth loss. To prevent this: ?Brush your teeth twice a day. ?Floss at least once a day. ?Visit your dentist 2 times a year. ?Managing stress ?Living with diabetes can be stressful. When you are experiencing stress, your blood glucose may be affected in two ways: ?Stress hormones may cause your blood glucose to rise. ?You may be distracted from taking good care of yourself. ?Be aware of your stress level and make changes to help you manage challenging situations. To lower your stress levels: ?Consider joining a support  group. ?Do planned relaxation or meditation. ?Do a hobby that you enjoy. ?Maintain healthy relationships. ?Exercise regularly. ?Work with your health care provider or a mental health professional. ?Where to find more information ?American Diabetes Association: www.diabetes.org ?Association of Diabetes Care and Education Specialists: www.diabeteseducator.org ?Summary ?You can take action to prevent or slow down problems that are caused by diabetes (diabetes mellitus). Following your diabetes plan and taking care of yourself can reduce your risk of serious or life-threatening complications. ?Follow instructions from your health care providers about managing your diabetes. Your diabetes may be managed by a team of health care providers who can teach you how to care for yourself and can answer questions that you have. ?Know your target range for your blood sugar (glucose), and check your blood glucose levels as often as told. Your health care provider will help you decide how often you should check your blood glucose level depending on your treatment goals and how well you are meeting them. ?Your health care provider will tell you how often you need medical visits depending on your diabetes management plan. Keep all follow-up visits as directed. This is important so possible problems can be identified early and complications can be avoided or treated. ?This information is not intended to replace advice given to you by your health care provider. Make sure you discuss  any questions you have with your health care provider. ?Document Revised: 10/30/2019 Document Reviewed: 10/30/2019 ?Elsevier Patient Education ? 2023 Elsevier Inc. ? ?

## 2022-02-07 ENCOUNTER — Telehealth: Payer: Self-pay

## 2022-02-07 LAB — CMP14+EGFR
ALT: 39 IU/L (ref 0–44)
AST: 25 IU/L (ref 0–40)
Albumin/Globulin Ratio: 1.9 (ref 1.2–2.2)
Albumin: 4.8 g/dL (ref 4.0–5.0)
Alkaline Phosphatase: 57 IU/L (ref 44–121)
BUN/Creatinine Ratio: 13 (ref 9–20)
BUN: 15 mg/dL (ref 6–24)
Bilirubin Total: <0.2 mg/dL (ref 0.0–1.2)
CO2: 23 mmol/L (ref 20–29)
Calcium: 9.9 mg/dL (ref 8.7–10.2)
Chloride: 98 mmol/L (ref 96–106)
Creatinine, Ser: 1.13 mg/dL (ref 0.76–1.27)
Globulin, Total: 2.5 g/dL (ref 1.5–4.5)
Glucose: 284 mg/dL — ABNORMAL HIGH (ref 70–99)
Potassium: 4.8 mmol/L (ref 3.5–5.2)
Sodium: 138 mmol/L (ref 134–144)
Total Protein: 7.3 g/dL (ref 6.0–8.5)
eGFR: 82 mL/min/{1.73_m2} (ref >59)

## 2022-02-07 LAB — MICROALBUMIN / CREATININE URINE RATIO
Creatinine, Urine: 113.7 mg/dL
Microalb/Creat Ratio: 12 mg/g creat (ref 0–29)
Microalbumin, Urine: 13.1 ug/mL

## 2022-02-07 LAB — HCV INTERPRETATION

## 2022-02-07 LAB — HCV AB W REFLEX TO QUANT PCR: HCV Ab: NONREACTIVE

## 2022-02-07 NOTE — Telephone Encounter (Signed)
DEXCOM G6 PA HAS BEEN DENIED-PT DOES NOT MEET MEDICAL CRITERIA. PT AWARE ?

## 2022-02-08 ENCOUNTER — Telehealth: Payer: Self-pay | Admitting: Family Medicine

## 2022-02-08 ENCOUNTER — Other Ambulatory Visit (HOSPITAL_COMMUNITY): Payer: Self-pay

## 2022-02-08 ENCOUNTER — Other Ambulatory Visit: Payer: Self-pay

## 2022-02-08 ENCOUNTER — Encounter: Payer: Self-pay | Admitting: Family Medicine

## 2022-02-08 NOTE — Telephone Encounter (Signed)
Copied from [place] [id number]. Topic: General - Other >> Feb 08, 2022  3:02 PM Yvette Rack wrote: Reason for CRM: Gareth Eagle with [facility] stated the Rx request for Dexcom was denied due to medical necessity not met. Alleen Borne stated the doctor can appeal. Cb# [phone] Ext. [phone]

## 2022-02-08 NOTE — Telephone Encounter (Signed)
Can we try to appeal this? Thanks

## 2022-02-08 NOTE — Telephone Encounter (Signed)
FYI

## 2022-02-09 NOTE — Telephone Encounter (Signed)
Will route to PCP to see if there is a medical reason we can write-up to justify an appeal. From what I read in the notes, it looks like he is simply nonadherent checking CBGs at home with the traditional method.   Dr. Margarita Rana,   Do you know of any reasoning we could use to submit the appeal?

## 2022-02-09 NOTE — Telephone Encounter (Signed)
Cory Lewis,   We tried to get PA approval for [name] for this patient but he did not meet medical criteria. Are we able to appeal at this time?

## 2022-02-09 NOTE — Telephone Encounter (Signed)
Pt called back, has questions for clinic please advise

## 2022-02-09 NOTE — Telephone Encounter (Signed)
You are right. He simply does not check with the traditional method and is not interested in getting on insulin or any injectable. If he does not meet criteria per his insurance guidelines we can inform him accordingly. Thank you for trying.

## 2022-02-09 NOTE — Telephone Encounter (Signed)
Call returned to patient. Discussed with him that his Dexcom supplies are not covered. He understands and has no questions at this time. I explained his appeal would be denied since there is no medical reasoning to justify CGM use without him being on insulin.   Of note, his GI and ophthal referrals were authorized. I told him it takes them some time before they will call and schedule his appts. I did provide him the numbers of [facility] and [facility]. He was thankful for my call.

## 2022-02-19 ENCOUNTER — Encounter: Payer: Self-pay | Admitting: Family Medicine

## 2022-02-22 DIAGNOSIS — Z419 Encounter for procedure for purposes other than remedying health state, unspecified: Secondary | ICD-10-CM | POA: Diagnosis not present

## 2022-03-08 ENCOUNTER — Telehealth: Payer: Self-pay | Admitting: Family Medicine

## 2022-03-08 NOTE — Telephone Encounter (Signed)
Terri Calling from [facility] calling to let Dr. Alvis Lemmings that the pt's insurance denied the pts sleep study.  CB- [phone]

## 2022-03-09 NOTE — Telephone Encounter (Signed)
Please inform the patient accordingly. Thanks.

## 2022-03-13 NOTE — Telephone Encounter (Signed)
Patient aware.

## 2022-03-14 NOTE — Telephone Encounter (Signed)
I spoke to Cory Lewis/ [facility] who said she faxed a copy of the denial letter to [facility].   Dr Alvis Lemmings- did you receive this ?  It should explain why the referral was denied and what we need to do to re-submit   thanks

## 2022-03-14 NOTE — Telephone Encounter (Signed)
No, I do not recall seeing this.

## 2022-03-20 ENCOUNTER — Telehealth: Payer: Self-pay | Admitting: Family Medicine

## 2022-03-20 MED ORDER — MISC. DEVICES MISC: 0 refills | Status: AC

## 2022-03-21 NOTE — Telephone Encounter (Signed)
noted 

## 2022-03-24 DIAGNOSIS — Z419 Encounter for procedure for purposes other than remedying health state, unspecified: Secondary | ICD-10-CM | POA: Diagnosis not present

## 2022-03-26 ENCOUNTER — Telehealth: Payer: Self-pay

## 2022-03-26 DIAGNOSIS — R29818 Other symptoms and signs involving the nervous system: Secondary | ICD-10-CM

## 2022-03-26 NOTE — Telephone Encounter (Signed)
Done

## 2022-03-26 NOTE — Telephone Encounter (Signed)
Home seep study order needs to be placed in epic so [facility] can schedule patient.

## 2022-04-24 DIAGNOSIS — Z419 Encounter for procedure for purposes other than remedying health state, unspecified: Secondary | ICD-10-CM | POA: Diagnosis not present

## 2022-05-02 ENCOUNTER — Encounter: Payer: Self-pay | Admitting: Family Medicine

## 2022-05-03 ENCOUNTER — Other Ambulatory Visit: Payer: Self-pay | Admitting: Family Medicine

## 2022-05-03 DIAGNOSIS — E1165 Type 2 diabetes mellitus with hyperglycemia: Secondary | ICD-10-CM

## 2022-05-03 MED ORDER — METFORMIN HCL ER 500 MG PO TB24: 2000.0000 mg | ORAL_TABLET | Freq: Every evening | ORAL | 6 refills | Status: DC

## 2022-05-25 DIAGNOSIS — Z419 Encounter for procedure for purposes other than remedying health state, unspecified: Secondary | ICD-10-CM | POA: Diagnosis not present

## 2022-05-29 ENCOUNTER — Other Ambulatory Visit: Payer: Self-pay | Admitting: Family Medicine

## 2022-05-29 ENCOUNTER — Ambulatory Visit (HOSPITAL_BASED_OUTPATIENT_CLINIC_OR_DEPARTMENT_OTHER): Payer: Commercial Managed Care - HMO | Attending: Family Medicine | Admitting: Internal Medicine

## 2022-05-29 VITALS — Ht 64.0 in | Wt 223.0 lb

## 2022-05-29 DIAGNOSIS — R0683 Snoring: Secondary | ICD-10-CM | POA: Diagnosis not present

## 2022-05-29 DIAGNOSIS — R29818 Other symptoms and signs involving the nervous system: Secondary | ICD-10-CM | POA: Insufficient documentation

## 2022-05-29 DIAGNOSIS — G4733 Obstructive sleep apnea (adult) (pediatric): Secondary | ICD-10-CM | POA: Insufficient documentation

## 2022-05-31 ENCOUNTER — Other Ambulatory Visit (HOSPITAL_COMMUNITY): Payer: Self-pay

## 2022-05-31 MED ORDER — OMEPRAZOLE 20 MG PO CPDR
20.0000 mg | DELAYED_RELEASE_CAPSULE | Freq: Every day | ORAL | 2 refills | Status: DC
Start: 2022-05-31 — End: 2022-06-05
  Filled 2022-05-31: qty 30, 30d supply, fill #0

## 2022-05-31 NOTE — Telephone Encounter (Signed)
Will forward to provider  

## 2022-06-05 ENCOUNTER — Ambulatory Visit: Payer: Commercial Managed Care - HMO | Attending: Family Medicine | Admitting: Family Medicine

## 2022-06-05 ENCOUNTER — Encounter: Payer: Self-pay | Admitting: Family Medicine

## 2022-06-05 ENCOUNTER — Encounter: Payer: Self-pay | Admitting: Gastroenterology

## 2022-06-05 VITALS — BP 121/76 | HR 82 | Temp 98.3°F | Wt 230.6 lb

## 2022-06-05 DIAGNOSIS — E1169 Type 2 diabetes mellitus with other specified complication: Secondary | ICD-10-CM

## 2022-06-05 DIAGNOSIS — B351 Tinea unguium: Secondary | ICD-10-CM | POA: Diagnosis not present

## 2022-06-05 DIAGNOSIS — E785 Hyperlipidemia, unspecified: Secondary | ICD-10-CM

## 2022-06-05 DIAGNOSIS — E1165 Type 2 diabetes mellitus with hyperglycemia: Secondary | ICD-10-CM

## 2022-06-05 LAB — POCT GLYCOSYLATED HEMOGLOBIN (HGB A1C): HbA1c, POC (controlled diabetic range): 11.6 % — AB (ref 0.0–7.0)

## 2022-06-05 LAB — GLUCOSE, POCT (MANUAL RESULT ENTRY): POC Glucose: 274 mg/dl — AB (ref 70–99)

## 2022-06-05 MED ORDER — GLIPIZIDE ER 10 MG PO TB24: 20.0000 mg | ORAL_TABLET | Freq: Every evening | ORAL | 6 refills | Status: DC

## 2022-06-05 MED ORDER — ATORVASTATIN CALCIUM 40 MG PO TABS: 40.0000 mg | ORAL_TABLET | Freq: Every day | ORAL | 6 refills | Status: DC

## 2022-06-05 MED ORDER — TERBINAFINE HCL 250 MG PO TABS: 250.0000 mg | ORAL_TABLET | Freq: Every day | ORAL | 1 refills | Status: DC

## 2022-06-05 MED ORDER — OMEPRAZOLE 20 MG PO CPDR: 20.0000 mg | DELAYED_RELEASE_CAPSULE | Freq: Every day | ORAL | 2 refills | Status: DC

## 2022-06-05 MED ORDER — METFORMIN HCL ER 500 MG PO TB24: 2000.0000 mg | ORAL_TABLET | Freq: Every evening | ORAL | 6 refills | Status: DC

## 2022-06-05 MED ORDER — DAPAGLIFLOZIN PROPANEDIOL 10 MG PO TABS: 10.0000 mg | ORAL_TABLET | Freq: Every day | ORAL | 6 refills | Status: DC

## 2022-06-05 NOTE — Progress Notes (Signed)
Subjective:  Patient ID: Cory Lewis, male    DOB: 26-Jul-1976  Age: 46 y.o. MRN: [id number]  CC: Diabetes   HPI Cory Lewis is a 46 y.o. year old male with a history of type 2 diabetes mellitus (A1c 11.6 from today) who presents today for a follow-up visit.  Interval History: He is A1c is 11.6 up from 9.3 previously. His blood sugars fluctuate from 150-160 but he does have some elevations to about 250, 300. He said he had been out of Glipizide and has only been taking metformin.  Wilder Glade appears on his med list but he never picked this up from the pharmacy. Denies presence of neuropathy, hypoglycemia but sometimes does have some blurry vision. He is not up to date on annual eye exam.  Endorses adherence with his statin. Denies additional concerns today. Past Medical History:  Diagnosis Date   Asthma    Diabetes mellitus without complication ([place])     Past Surgical History:  Procedure Laterality Date   NO PAST SURGERIES      Family History  Problem Relation Age of Onset   Diabetes Mother    Diabetes Father     Social History   Socioeconomic History   Marital status: Married    Spouse name: Not on file   Number of children: Not on file   Years of education: Not on file   Highest education level: Not on file  Occupational History   Not on file  Tobacco Use   Smoking status: Never   Smokeless tobacco: Never  Substance and Sexual Activity   Alcohol use: No   Drug use: No   Sexual activity: Not Currently  Other Topics Concern   Not on file  Social History Narrative   Not on file   Social Determinants of Health   Financial Resource Strain: Not on file  Food Insecurity: Not on file  Transportation Needs: Not on file  Physical Activity: Not on file  Stress: Not on file  Social Connections: Not on file    No Known Allergies  Outpatient Medications Prior to Visit  Medication Sig Dispense Refill   Blood Glucose Monitoring Suppl (ONETOUCH VERIO) w/Device  KIT Use as instructed to check blood sugar daily. E11.65 1 kit 0   Continuous Blood Gluc Receiver (DEXCOM G6 RECEIVER) DEVI Use to check blood sugar three times daily. E11.65 1 each 0   Continuous Blood Gluc Sensor (DEXCOM G6 SENSOR) MISC Use to check blood sugar three times daily. Change sensor once every 10 days. E11.65 3 each 2   Continuous Blood Gluc Transmit (DEXCOM G6 TRANSMITTER) MISC Use to check blood sugar three times daily. Change transmitter once every 90 days. E11.65 1 each 1   glucose blood (ONETOUCH VERIO) test strip Use as instructed to check blood sugar daily. E11.65 100 each 11   ibuprofen (ADVIL,MOTRIN) 200 MG tablet Take 2 tablets (400 mg total) by mouth every 6 (six) hours as needed for moderate pain. 30 tablet 0   metaxalone (SKELAXIN) 800 MG tablet Take 1 tablet (800 mg total) by mouth 3 (three) times daily. 30 tablet 0   Misc. Devices MISC Home sleep study.  Diagnosis-obstructive sleep apnea 1 each 0   Multiple Vitamin (MULTIVITAMIN WITH MINERALS) TABS tablet Take 1 tablet by mouth daily.     OneTouch Delica Lancets 85U MISC Use as instructed to check blood sugar daily. E11.65 100 each 11   trimethoprim-polymyxin b (POLYTRIM) ophthalmic solution Place 2 drops into the left eye  every 4 (four) hours. 10 mL 0   atorvastatin (LIPITOR) 40 MG tablet Take 1 tablet (40 mg total) by mouth daily. 30 tablet 6   dapagliflozin propanediol (FARXIGA) 10 MG TABS tablet Take 1 tablet (10 mg total) by mouth daily before breakfast. 30 tablet 6   glipiZIDE (GLUCOTROL XL) 10 MG 24 hr tablet Take 2 tablets (20 mg total) by mouth every evening. 60 tablet 6   metFORMIN (GLUCOPHAGE XR) 500 MG 24 hr tablet Take 4 tablets (2,000 mg total) by mouth every evening. 120 tablet 6   omeprazole (PRILOSEC) 20 MG capsule Take 1 capsule (20 mg total) by mouth daily. 30 capsule 2   No facility-administered medications prior to visit.     ROS Review of Systems  Constitutional:  Negative for activity change  and appetite change.  HENT:  Negative for sinus pressure and sore throat.   Respiratory:  Negative for chest tightness, shortness of breath and wheezing.   Cardiovascular:  Negative for chest pain and palpitations.  Gastrointestinal:  Negative for abdominal distention, abdominal pain and constipation.  Genitourinary: Negative.   Musculoskeletal: Negative.   Psychiatric/Behavioral:  Negative for behavioral problems and dysphoric mood.     Objective:  BP 121/76   Pulse 82   Temp 98.3 F (36.8 C) (Oral)   Wt 230 lb 9.6 oz (104.6 kg)   SpO2 95%   BMI 39.58 kg/m      06/05/2022    1:41 PM 05/29/2022   11:30 AM 02/06/2022    2:40 PM  BP/Weight  Systolic BP 970  263  Diastolic BP 76  77  Wt. (Lbs) 230.6 223 236.8  BMI 39.58 kg/m2 38.28 kg/m2 41.95 kg/m2      Physical Exam Constitutional:      Appearance: He is well-developed.  Cardiovascular:     Rate and Rhythm: Normal rate.     Heart sounds: Normal heart sounds. No murmur heard. Pulmonary:     Effort: Pulmonary effort is normal.     Breath sounds: Normal breath sounds. No wheezing or rales.  Chest:     Chest wall: No tenderness.  Abdominal:     General: Bowel sounds are normal. There is no distension.     Palpations: Abdomen is soft. There is no mass.     Tenderness: There is no abdominal tenderness.  Musculoskeletal:        General: Normal range of motion.     Right lower leg: No edema.     Left lower leg: No edema.  Neurological:     Mental Status: He is alert and oriented to person, place, and time.  Psychiatric:        Mood and Affect: Mood normal.        Latest Ref Rng & Units 02/06/2022    3:11 PM 04/29/2020    4:23 PM 10/28/2019    4:15 PM  CMP  Glucose 70 - 99 mg/dL 284  174  103   BUN 6 - 24 mg/dL _0 Creatinine 0.76 - 1.27 mg/dL 1.13  1.22  1.16   Sodium 134 - 144 mmol/L 138  139  142   Potassium 3.5 - 5.2 mmol/L 4.8  4.3  4.5   Chloride 96 - 106 mmol/L 98  99  101   CO2 20 - 29 mmol/L _1 Calcium 8.7 - 10.2 mg/dL 9.9  9.5  10.3   Total Protein 6.0 - 8.5  g/dL 7.3  7.3  7.0   Total Bilirubin 0.0 - 1.2 mg/dL <0.2  <0.2  <0.2   Alkaline Phos 44 - 121 IU/L 57  58  60   AST 0 - 40 IU/L _0 ALT 0 - 44 IU/L 39  28  15     Lipid Panel     Component Value Date/Time   CHOL 206 (H) 04/29/2020 1623   TRIG 313 (H) 04/29/2020 1623   HDL 56 04/29/2020 1623   CHOLHDL 3.7 04/29/2020 1623   LDLCALC 98 04/29/2020 1623    CBC    Component Value Date/Time   WBC 4.9 08/08/2014 1850   RBC 4.36 08/08/2014 1850   HGB 12.9 (L) 08/08/2014 1850   HCT 38.9 (L) 08/08/2014 1850   PLT 286 08/08/2014 1850   MCV 89.2 08/08/2014 1850   MCH 29.6 08/08/2014 1850   MCHC 33.2 08/08/2014 1850   RDW 13.1 08/08/2014 1850   LYMPHSABS 2.0 08/08/2014 1850   MONOABS 0.4 08/08/2014 1850   EOSABS 0.1 08/08/2014 1850   BASOSABS 0.0 08/08/2014 1850    Lab Results  Component Value Date   HGBA1C 11.6 (A) 06/05/2022    Assessment & Plan:  1. Type 2 diabetes mellitus with hyperglycemia, without long-term current use of insulin (HCC) Uncontrolled with A1c of 11.6; goal is less than 7.0 He will benefit from an injectable possibly GLP-1 RA or insulin however he is strongly opposed to an injectable.  Counseled that we may not be able to achieve euglycemia with his current regimen. I have sent refills for his metformin, glipizide and Farxiga and will reassess glycemic control at next visit Counseled on Diabetic diet, my plate method, 768 minutes of moderate intensity exercise/week Blood sugar logs with fasting goals of 80-120 mg/dl, random of less than 180 and in the event of sugars less than 60 mg/dl or greater than 400 mg/dl encouraged to notify the clinic. Advised on the need for annual eye exams, annual foot exams, Pneumonia vaccine. - POCT glycosylated hemoglobin (Hb A1C) - POCT glucose (manual entry) - glipiZIDE (GLUCOTROL XL) 10 MG 24 hr tablet; Take 2 tablets (20 mg total) by mouth  every evening.  Dispense: 60 tablet; Refill: 6 - Ambulatory referral to Ophthalmology - CMP14+EGFR; Future - CBC with Differential/Platelet; Future - metFORMIN (GLUCOPHAGE XR) 500 MG 24 hr tablet; Take 4 tablets (2,000 mg total) by mouth every evening.  Dispense: 120 tablet; Refill: 6 - dapagliflozin propanediol (FARXIGA) 10 MG TABS tablet; Take 1 tablet (10 mg total) by mouth daily before breakfast.  Dispense: 30 tablet; Refill: 6  2. Hyperlipidemia associated with type 2 diabetes mellitus ([place]) Uncontrolled We will check lipid panel and adjust regimen accordingly Low-cholesterol diet - LP+Non-HDL Cholesterol; Future - atorvastatin (LIPITOR) 40 MG tablet; Take 1 tablet (40 mg total) by mouth daily.  Dispense: 30 tablet; Refill: 6  3 Onychomycosis Placed on terbinafine Counseled that treatment of fungal infection does not provide assurance of restoration of nail color. History of diabetes predisposes him to this.  Health Care Maintenance: Previously referred for colonoscopy and we have provided him with a number to reach out to GI for an appointment as they tried to reach him but have been unsuccessful. Meds ordered this encounter  Medications   glipiZIDE (GLUCOTROL XL) 10 MG 24 hr tablet    Sig: Take 2 tablets (20 mg total) by mouth every evening.    Dispense:  60 tablet    Refill:  6   atorvastatin (LIPITOR) 40 MG tablet    Sig: Take 1 tablet (40 mg total) by mouth daily.    Dispense:  30 tablet    Refill:  6    Discontinue 20 mg   metFORMIN (GLUCOPHAGE XR) 500 MG 24 hr tablet    Sig: Take 4 tablets (2,000 mg total) by mouth every evening.    Dispense:  120 tablet    Refill:  6   omeprazole (PRILOSEC) 20 MG capsule    Sig: Take 1 capsule (20 mg total) by mouth daily.    Dispense:  30 capsule    Refill:  2   dapagliflozin propanediol (FARXIGA) 10 MG TABS tablet    Sig: Take 1 tablet (10 mg total) by mouth daily before breakfast.    Dispense:  30 tablet    Refill:  6     Follow-up: Return in about 3 months (around 09/04/2022) for Chronic medical conditions.       Charlott Rakes, MD, FAAFP. [facility] and [facility], [place]   06/05/2022, 1:58 PM

## 2022-06-05 NOTE — Patient Instructions (Signed)
[facility] [address] [place], [place] [address] PH# [phone] 

## 2022-06-08 ENCOUNTER — Ambulatory Visit: Payer: Commercial Managed Care - HMO | Attending: Family Medicine

## 2022-06-08 DIAGNOSIS — E1169 Type 2 diabetes mellitus with other specified complication: Secondary | ICD-10-CM | POA: Diagnosis not present

## 2022-06-08 DIAGNOSIS — E1165 Type 2 diabetes mellitus with hyperglycemia: Secondary | ICD-10-CM | POA: Diagnosis not present

## 2022-06-08 DIAGNOSIS — E785 Hyperlipidemia, unspecified: Secondary | ICD-10-CM | POA: Diagnosis not present

## 2022-06-09 ENCOUNTER — Encounter: Payer: Self-pay | Admitting: Family Medicine

## 2022-06-09 LAB — CMP14+EGFR
ALT: 32 IU/L (ref 0–44)
AST: 28 IU/L (ref 0–40)
Albumin/Globulin Ratio: 1.9 (ref 1.2–2.2)
Albumin: 4.8 g/dL (ref 4.1–5.1)
Alkaline Phosphatase: 68 IU/L (ref 44–121)
BUN/Creatinine Ratio: 15 (ref 9–20)
BUN: 17 mg/dL (ref 6–24)
Bilirubin Total: 0.3 mg/dL (ref 0.0–1.2)
CO2: 22 mmol/L (ref 20–29)
Calcium: 9.9 mg/dL (ref 8.7–10.2)
Chloride: 95 mmol/L — ABNORMAL LOW (ref 96–106)
Creatinine, Ser: 1.15 mg/dL (ref 0.76–1.27)
Globulin, Total: 2.5 g/dL (ref 1.5–4.5)
Glucose: 115 mg/dL — ABNORMAL HIGH (ref 70–99)
Potassium: 4.2 mmol/L (ref 3.5–5.2)
Sodium: 137 mmol/L (ref 134–144)
Total Protein: 7.3 g/dL (ref 6.0–8.5)
eGFR: 79 mL/min/{1.73_m2} (ref >59)

## 2022-06-09 LAB — CBC WITH DIFFERENTIAL/PLATELET
Basophils Absolute: 0 10*3/uL (ref 0.0–0.2)
Basos: 0 %
EOS (ABSOLUTE): 0 10*3/uL (ref 0.0–0.4)
Eos: 1 %
Hematocrit: 40.3 % (ref 37.5–51.0)
Hemoglobin: 13.9 g/dL (ref 13.0–17.7)
Immature Grans (Abs): 0 10*3/uL (ref 0.0–0.1)
Immature Granulocytes: 0 %
Lymphocytes Absolute: 2.4 10*3/uL (ref 0.7–3.1)
Lymphs: 37 %
MCH: 30.6 pg (ref 26.6–33.0)
MCHC: 34.5 g/dL (ref 31.5–35.7)
MCV: 89 fL (ref 79–97)
Monocytes Absolute: 0.4 10*3/uL (ref 0.1–0.9)
Monocytes: 6 %
Neutrophils Absolute: 3.6 10*3/uL (ref 1.4–7.0)
Neutrophils: 56 %
Platelets: 321 10*3/uL (ref 150–450)
RBC: 4.54 x10E6/uL (ref 4.14–5.80)
RDW: 12.1 % (ref 11.6–15.4)
WBC: 6.5 10*3/uL (ref 3.4–10.8)

## 2022-06-09 LAB — LP+NON-HDL CHOLESTEROL
Cholesterol, Total: 130 mg/dL (ref 100–199)
HDL: 48 mg/dL (ref >39)
LDL Chol Calc (NIH): 63 mg/dL (ref 0–99)
Total Non-HDL-Chol (LDL+VLDL): 82 mg/dL (ref 0–129)
Triglycerides: 101 mg/dL (ref 0–149)
VLDL Cholesterol Cal: 19 mg/dL (ref 5–40)

## 2022-06-10 DIAGNOSIS — R29818 Other symptoms and signs involving the nervous system: Secondary | ICD-10-CM

## 2022-06-10 NOTE — Procedures (Signed)
   Patient Name: Cory Lewis, Cory Lewis Date: 05/30/2022 Gender: Male D.O.B: Oct 12, 1975 Age (years): 45 Referring Provider: Arnoldo Morale Height (inches): 64 Interpreting Physician: Baird Lyons MD, ABSM Weight (lbs): 223 RPSGT: Jacolyn Reedy BMI: 38 MRN: [id number] Neck Size: 17.50  CLINICAL INFORMATION Sleep Study Type: HST Indication for sleep study: OSA Epworth Sleepiness Score: 7  SLEEP STUDY TECHNIQUE A multi-channel overnight portable sleep study was performed. The channels recorded were: nasal airflow, thoracic respiratory movement, and oxygen saturation with a pulse oximetry. Snoring was also monitored.  MEDICATIONS Patient self administered medications include: none reported.  SLEEP ARCHITECTURE Patient was studied for 369.5 minutes. The sleep efficiency was 100.0 % and the patient was supine for 0%. The arousal index was 0.0 per hour.  RESPIRATORY PARAMETERS The overall AHI was 30.9 per hour, with a central apnea index of 0 per hour. The oxygen nadir was 81% during sleep.  CARDIAC DATA Mean heart rate during sleep was 68.1 bpm.  IMPRESSIONS - Severe obstructive sleep apnea occurred during this study (AHI = 30.9/h). - Moderate oxygen desaturation was noted during this study (Min O2 = 81%). Mean 95% - Patient snored.  DIAGNOSIS - Obstructive Sleep Apnea (G47.33)  RECOMMENDATIONS - Suggest CPAP titration sleep study or autopap. Other options would be based on clinica judgment. - Be careful with alcohol, sedatives and other CNS depressants that may worsen sleep apnea and disrupt normal sleep architecture. - Sleep hygiene should be reviewed to assess factors that may improve sleep quality. - Weight management and regular exercise should be initiated or continued.  [Electronically signed] 06/10/2022 11:14 AM  Baird Lyons MD, ABSM Diplomate, American Board of Sleep Medicine NPI: [id number]                          [place]  ELECTRONICALLY SIGNED ON:  06/10/2022, 11:12 AM [place] PH: [phone]   FX: [phone] [place]

## 2022-06-11 ENCOUNTER — Other Ambulatory Visit: Payer: Self-pay | Admitting: Family Medicine

## 2022-06-11 DIAGNOSIS — G4733 Obstructive sleep apnea (adult) (pediatric): Secondary | ICD-10-CM

## 2022-06-24 DIAGNOSIS — Z419 Encounter for procedure for purposes other than remedying health state, unspecified: Secondary | ICD-10-CM | POA: Diagnosis not present

## 2022-07-06 ENCOUNTER — Telehealth: Payer: Self-pay | Admitting: Family Medicine

## 2022-07-06 ENCOUNTER — Ambulatory Visit: Payer: Commercial Managed Care - HMO | Admitting: Gastroenterology

## 2022-07-06 ENCOUNTER — Telehealth: Payer: Self-pay | Admitting: Emergency Medicine

## 2022-07-06 NOTE — Telephone Encounter (Signed)
Pt is calling to request of Dr. 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  [place] [phone] Dr. Bo Merino.   CB patient- [phone]

## 2022-07-06 NOTE — Telephone Encounter (Signed)
Pt called back in to follow up. 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 Pt says that PCP never got him set up with a machine. Pt would like further assistance as soon as possible as he doesn't want to wait to the last minute. Pt says to please not send to the location that was previously provided because he is no longer at that location.    Please follow up / assist pt further.

## 2022-07-06 NOTE — Telephone Encounter (Signed)
Copied from [place] [phone]. Topic: General - Other >> Jul 06, 2022  3:07 PM Cyndi Bender wrote: Reason for CRM: Sleep Center rep reports that pt insurance denied request and a copy of the denial will be sent in.

## 2022-07-09 ENCOUNTER — Telehealth: Payer: Self-pay

## 2022-07-09 ENCOUNTER — Other Ambulatory Visit: Payer: Self-pay | Admitting: Family Medicine

## 2022-07-09 DIAGNOSIS — E1169 Type 2 diabetes mellitus with other specified complication: Secondary | ICD-10-CM

## 2022-07-09 DIAGNOSIS — E1165 Type 2 diabetes mellitus with hyperglycemia: Secondary | ICD-10-CM

## 2022-07-09 MED ORDER — DAPAGLIFLOZIN PROPANEDIOL 10 MG PO TABS: 10.0000 mg | ORAL_TABLET | Freq: Every day | ORAL | 6 refills | Status: DC

## 2022-07-09 MED ORDER — MISC. DEVICES MISC: 0 refills | Status: AC

## 2022-07-09 MED ORDER — OMEPRAZOLE 20 MG PO CPDR: 20.0000 mg | DELAYED_RELEASE_CAPSULE | Freq: Every day | ORAL | 6 refills | Status: DC

## 2022-07-09 NOTE — Telephone Encounter (Signed)
I called the patient and explained that Dr Margarita Rana had ordered a CPAP titration but his insurance, [facility], denied the titration study, they did not deny a CPAP machine.   I informed him that Dr Margarita Rana has placed an order for an AutoPAP.  He wanted to know how long it would take him to receive it because he needs it by Jan 2024 so he does not loose his job.  I said I cannot give him a time frame.  He said he did not have a preference for DME companies. I explained that I will send it to [facility] and they will contact him about insurance coverage.  If they are not in network with his insurance company, we will send the order to another DME company that is in network. He said he understood and I instructed him to call me if he has any questions.   Order for AutoPAP faxed to [facility].

## 2022-07-09 NOTE — Telephone Encounter (Signed)
I spoke to [place] and she said that the split night sleep study was denied.  She faxed the denial letter to Dr Margarita Rana on 07/06/2022.  A CPAP has not been ordered yet

## 2022-07-09 NOTE — Telephone Encounter (Signed)
Yes, I explained that to him

## 2022-07-09 NOTE — Telephone Encounter (Signed)
I have placed an order for AutoPAP for him. If you could also explain to him there was an option for a split night study which would have provided more specific instructions for his CPAP but this was denied by insurance.Thanks

## 2022-07-09 NOTE — Telephone Encounter (Signed)
Pt states that he never got his CPAP machine.

## 2022-07-10 NOTE — Telephone Encounter (Signed)
I called [facility] and spoke to [place] who said that she could not locate the referral for AutoPAP that was faxed yesterday.  She requested I email it to her [email or web address].  I then emailed the referral as requested.

## 2022-07-12 NOTE — Telephone Encounter (Signed)
I called the patient regarding the AutoPap order. He said he called [facility] and they told him that they are just waiting for insurance approval and he can call them back at any time to check on the status of the order.  He said he hopes this process doesn't take 1- 2 months and I told him that it should not take that long. He said that is what [employer] [facility] told him.

## 2022-07-12 NOTE — Telephone Encounter (Signed)
FYI

## 2022-07-13 NOTE — Telephone Encounter (Signed)
We are already working on AutoPap for him.

## 2022-07-19 DIAGNOSIS — G4733 Obstructive sleep apnea (adult) (pediatric): Secondary | ICD-10-CM | POA: Diagnosis not present

## 2022-07-25 DIAGNOSIS — Z419 Encounter for procedure for purposes other than remedying health state, unspecified: Secondary | ICD-10-CM | POA: Diagnosis not present

## 2022-07-31 ENCOUNTER — Telehealth: Payer: Self-pay

## 2022-07-31 NOTE — Telephone Encounter (Signed)
Fax received with no cover sheet stated Cory Lewis is unable to process request for new PAP set up due to benefit plan does not cover services performed by out of network healthcare professionals.  I spoke to Jay/ pre-cert/ Cory Lewis [phone] and he said he could not locate any information about this fax that was received and without a procedure code, he could not provide any assistance. There was no procedure code on the fax.   I spoke to [place] and she first noted that the patient has a large deductible with [employer]. He still has $635.60 remaining out of $2814.82 OOP.  She then noted he has [facility] and has no balance and he received the CPAP machine 07/19/2022.   I called the patient and he confirmed receipt of the CPAP machine.

## 2022-08-08 ENCOUNTER — Encounter: Payer: Self-pay | Admitting: Family Medicine

## 2022-08-19 DIAGNOSIS — G4733 Obstructive sleep apnea (adult) (pediatric): Secondary | ICD-10-CM | POA: Diagnosis not present

## 2022-08-21 ENCOUNTER — Ambulatory Visit: Payer: Commercial Managed Care - HMO | Admitting: Gastroenterology

## 2022-08-24 DIAGNOSIS — Z419 Encounter for procedure for purposes other than remedying health state, unspecified: Secondary | ICD-10-CM | POA: Diagnosis not present

## 2022-09-05 ENCOUNTER — Ambulatory Visit: Payer: Commercial Managed Care - HMO | Attending: Family Medicine | Admitting: Family Medicine

## 2022-09-05 ENCOUNTER — Encounter: Payer: Self-pay | Admitting: Family Medicine

## 2022-09-05 VITALS — BP 138/88 | HR 75 | Temp 98.3°F | Resp 18 | Ht 63.0 in | Wt 242.6 lb

## 2022-09-05 DIAGNOSIS — E785 Hyperlipidemia, unspecified: Secondary | ICD-10-CM | POA: Diagnosis not present

## 2022-09-05 DIAGNOSIS — E1169 Type 2 diabetes mellitus with other specified complication: Secondary | ICD-10-CM | POA: Diagnosis not present

## 2022-09-05 DIAGNOSIS — G4733 Obstructive sleep apnea (adult) (pediatric): Secondary | ICD-10-CM

## 2022-09-05 LAB — POCT GLYCOSYLATED HEMOGLOBIN (HGB A1C): HbA1c, POC (controlled diabetic range): 7.5 % — AB (ref 0.0–7.0)

## 2022-09-05 LAB — GLUCOSE, POCT (MANUAL RESULT ENTRY): POC Glucose: 86 mg/dl (ref 70–99)

## 2022-09-05 NOTE — Progress Notes (Signed)
Subjective:  Patient ID: Cory Lewis, male    DOB: 1976/06/25  Age: 46 y.o. MRN: [id number]  CC: Diabetes   HPI Sherod Cisse is a 46 y.o. year old male with a history of type 2 diabetes mellitus (A1c 7.5 ), OSA (on CPAP) who presents today for a follow-up visit.   Interval History:  A1c is 7.5 down from 11.6 He is yet to undergo his eye exam.  Review of his chart indicate referral was placed and per notes in chart he was a no-show in 06/2022 but the patient denies ever knowing he had an appointment. He has had no hypoglycemic episodes or neuropathy and reports doing well on his current regimen. Endorses adherence with his statin. He is also adherent with his CPAP machine and reports significant improvement in his quality of sleep ever since he got on his CPAP machine. Denies additional concerns today. Past Medical History:  Diagnosis Date   Asthma    Diabetes mellitus without complication ([place])     Past Surgical History:  Procedure Laterality Date   NO PAST SURGERIES      Family History  Problem Relation Age of Onset   Diabetes Mother    Diabetes Father     Social History   Socioeconomic History   Marital status: Married    Spouse name: Not on file   Number of children: Not on file   Years of education: Not on file   Highest education level: Not on file  Occupational History   Not on file  Tobacco Use   Smoking status: Never   Smokeless tobacco: Never  Substance and Sexual Activity   Alcohol use: No   Drug use: No   Sexual activity: Not Currently  Other Topics Concern   Not on file  Social History Narrative   Not on file   Social Determinants of Health   Financial Resource Strain: Not on file  Food Insecurity: Not on file  Transportation Needs: Not on file  Physical Activity: Not on file  Stress: Not on file  Social Connections: Not on file    No Known Allergies  Outpatient Medications Prior to Visit  Medication Sig Dispense Refill    dapagliflozin propanediol (FARXIGA) 10 MG TABS tablet Take 1 tablet (10 mg total) by mouth daily before breakfast. 30 tablet 6   glipiZIDE (GLUCOTROL XL) 10 MG 24 hr tablet Take 2 tablets (20 mg total) by mouth every evening. 60 tablet 6   metFORMIN (GLUCOPHAGE XR) 500 MG 24 hr tablet Take 4 tablets (2,000 mg total) by mouth every evening. 120 tablet 6   Misc. Devices MISC Home sleep study.  Diagnosis-obstructive sleep apnea 1 each 0   Misc. Devices MISC AutoPAP 5-15cm H20. Diagnosis - Obstructive Sleep Apnea 1 each 0   omeprazole (PRILOSEC) 20 MG capsule Take 1 capsule (20 mg total) by mouth daily. 30 capsule 6   atorvastatin (LIPITOR) 40 MG tablet Take 1 tablet (40 mg total) by mouth daily. 30 tablet 6   Blood Glucose Monitoring Suppl (ONETOUCH VERIO) w/Device KIT Use as instructed to check blood sugar daily. E11.65 (Patient not taking: Reported on 09/05/2022) 1 kit 0   Continuous Blood Gluc Receiver (DEXCOM G6 RECEIVER) DEVI Use to check blood sugar three times daily. E11.65 (Patient not taking: Reported on 09/05/2022) 1 each 0   Continuous Blood Gluc Sensor (DEXCOM G6 SENSOR) MISC Use to check blood sugar three times daily. Change sensor once every 10 days. E11.65 (Patient not taking: Reported  on 09/05/2022) 3 each 2   Continuous Blood Gluc Transmit (DEXCOM G6 TRANSMITTER) MISC Use to check blood sugar three times daily. Change transmitter once every 90 days. E11.65 (Patient not taking: Reported on 09/05/2022) 1 each 1   glucose blood (ONETOUCH VERIO) test strip Use as instructed to check blood sugar daily. E11.65 (Patient not taking: Reported on 09/05/2022) 100 each 11   ibuprofen (ADVIL,MOTRIN) 200 MG tablet Take 2 tablets (400 mg total) by mouth every 6 (six) hours as needed for moderate pain. (Patient not taking: Reported on 09/05/2022) 30 tablet 0   OneTouch Delica Lancets 11D MISC Use as instructed to check blood sugar daily. E11.65 (Patient not taking: Reported on 09/05/2022) 100 each 11    terbinafine (LAMISIL) 250 MG tablet Take 1 tablet (250 mg total) by mouth daily. (Patient not taking: Reported on 09/05/2022) 30 tablet 1   metaxalone (SKELAXIN) 800 MG tablet Take 1 tablet (800 mg total) by mouth 3 (three) times daily. (Patient not taking: Reported on 09/05/2022) 30 tablet 0   Multiple Vitamin (MULTIVITAMIN WITH MINERALS) TABS tablet Take 1 tablet by mouth daily. (Patient not taking: Reported on 09/05/2022)     trimethoprim-polymyxin b (POLYTRIM) ophthalmic solution Place 2 drops into the left eye every 4 (four) hours. (Patient not taking: Reported on 09/05/2022) 10 mL 0   No facility-administered medications prior to visit.     ROS Review of Systems  Constitutional:  Negative for activity change and appetite change.  HENT:  Negative for sinus pressure and sore throat.   Respiratory:  Negative for chest tightness, shortness of breath and wheezing.   Cardiovascular:  Negative for chest pain and palpitations.  Gastrointestinal:  Negative for abdominal distention, abdominal pain and constipation.  Genitourinary: Negative.   Musculoskeletal: Negative.   Psychiatric/Behavioral:  Negative for behavioral problems and dysphoric mood.     Objective:  BP 138/88   Pulse 75   Temp 98.3 F (36.8 C) (Oral)   Resp 18   Ht _0  (1.6 m)   Wt 242 lb 9.6 oz (110 kg)   SpO2 97%   BMI 42.97 kg/m      09/05/2022    2:57 PM 09/05/2022    2:14 PM 06/05/2022    1:41 PM  BP/Weight  Systolic BP 552 080 223  Diastolic BP 88 84 76  Wt. (Lbs)  242.6 230.6  BMI  42.97 kg/m2 39.58 kg/m2      Physical Exam Constitutional:      Appearance: He is well-developed.  Cardiovascular:     Rate and Rhythm: Normal rate.     Heart sounds: Normal heart sounds. No murmur heard. Pulmonary:     Effort: Pulmonary effort is normal.     Breath sounds: Normal breath sounds. No wheezing or rales.  Chest:     Chest wall: No tenderness.  Abdominal:     General: Bowel sounds are normal. There is  no distension.     Palpations: Abdomen is soft. There is no mass.     Tenderness: There is no abdominal tenderness.  Musculoskeletal:        General: Normal range of motion.     Right lower leg: No edema.     Left lower leg: No edema.  Neurological:     Mental Status: He is alert and oriented to person, place, and time.  Psychiatric:        Mood and Affect: Mood normal.    Diabetic Foot Exam - Simple   Simple  Foot Form Diabetic Foot exam was performed with the following findings: Yes 09/05/2022  2:37 PM  Visual Inspection No deformities, no ulcerations, no other skin breakdown bilaterally: Yes Sensation Testing Intact to touch and monofilament testing bilaterally: Yes Pulse Check Posterior Tibialis and Dorsalis pulse intact bilaterally: Yes Comments        Latest Ref Rng & Units 06/08/2022   11:06 AM 02/06/2022    3:11 PM 04/29/2020    4:23 PM  CMP  Glucose 70 - 99 mg/dL 115  284  174   BUN 6 - 24 mg/dL _0 Creatinine 0.76 - 1.27 mg/dL 1.15  1.13  1.22   Sodium 134 - 144 mmol/L 137  138  139   Potassium 3.5 - 5.2 mmol/L 4.2  4.8  4.3   Chloride 96 - 106 mmol/L 95  98  99   CO2 20 - 29 mmol/L _1 Calcium 8.7 - 10.2 mg/dL 9.9  9.9  9.5   Total Protein 6.0 - 8.5 g/dL 7.3  7.3  7.3   Total Bilirubin 0.0 - 1.2 mg/dL 0.3  <0.2  <0.2   Alkaline Phos 44 - 121 IU/L 68  57  58   AST 0 - 40 IU/L _2 ALT 0 - 44 IU/L 32  39  28     Lipid Panel     Component Value Date/Time   CHOL 130 06/08/2022 1106   TRIG 101 06/08/2022 1106   HDL 48 06/08/2022 1106   CHOLHDL 3.7 04/29/2020 1623   LDLCALC 63 06/08/2022 1106    CBC    Component Value Date/Time   WBC 6.5 06/08/2022 1106   WBC 4.9 08/08/2014 1850   RBC 4.54 06/08/2022 1106   RBC 4.36 08/08/2014 1850   HGB 13.9 06/08/2022 1106   HCT 40.3 06/08/2022 1106   PLT 321 06/08/2022 1106   MCV 89 06/08/2022 1106   MCH 30.6 06/08/2022 1106   MCH 29.6 08/08/2014 1850   MCHC 34.5 06/08/2022 1106    MCHC 33.2 08/08/2014 1850   RDW 12.1 06/08/2022 1106   LYMPHSABS 2.4 06/08/2022 1106   MONOABS 0.4 08/08/2014 1850   EOSABS 0.0 06/08/2022 1106   BASOSABS 0.0 06/08/2022 1106    Lab Results  Component Value Date   HGBA1C 7.5 (A) 09/05/2022    Assessment & Plan:  1. Type 2 diabetes mellitus with other specified complication, without long-term current use of insulin (HCC) A1c of 7.5 down from 11.6 previously He has been commended on improvement Goal is less than 7.0 Continue current regimen I have provided him with the contact information for the ophthalmologist so he can schedule an appointment. Counseled on Diabetic diet, my plate method, 100 minutes of moderate intensity exercise/week Blood sugar logs with fasting goals of 80-120 mg/dl, random of less than 180 and in the event of sugars less than 60 mg/dl or greater than 400 mg/dl encouraged to notify the clinic. Advised on the need for annual eye exams, annual foot exams, Pneumonia vaccine. - POCT glucose (manual entry) - POCT glycosylated hemoglobin (Hb A1C)  2. OSA (obstructive sleep apnea) Controlled on CPAP machine  3. Hyperlipidemia associated with type 2 diabetes mellitus ([place]) Controlled Continue statin Low-cholesterol diet  Health Care Maintenance: Colonoscopy is still pending.  He had to reschedule. No orders of the defined types were placed in this encounter.   Follow-up: Return in about 3 months (around  12/05/2022) for Chronic medical conditions.       Charlott Rakes, MD, FAAFP. [facility] and [facility] [place], [place]   09/05/2022, 5:33 PM

## 2022-09-05 NOTE — Progress Notes (Signed)
No concerns. 

## 2022-09-05 NOTE — Patient Instructions (Addendum)
Ophthalmology: [facility] ph # [phone] address [address]

## 2022-09-12 DIAGNOSIS — Z5321 Procedure and treatment not carried out due to patient leaving prior to being seen by health care provider: Secondary | ICD-10-CM | POA: Diagnosis not present

## 2022-09-18 DIAGNOSIS — G4733 Obstructive sleep apnea (adult) (pediatric): Secondary | ICD-10-CM | POA: Diagnosis not present

## 2022-09-24 DIAGNOSIS — Z419 Encounter for procedure for purposes other than remedying health state, unspecified: Secondary | ICD-10-CM | POA: Diagnosis not present

## 2022-09-28 ENCOUNTER — Ambulatory Visit: Payer: Self-pay | Admitting: Gastroenterology

## 2022-10-25 DIAGNOSIS — Z419 Encounter for procedure for purposes other than remedying health state, unspecified: Secondary | ICD-10-CM | POA: Diagnosis not present

## 2022-10-30 ENCOUNTER — Ambulatory Visit: Payer: Self-pay | Admitting: Gastroenterology

## 2022-11-23 DIAGNOSIS — Z419 Encounter for procedure for purposes other than remedying health state, unspecified: Secondary | ICD-10-CM | POA: Diagnosis not present

## 2022-11-27 ENCOUNTER — Ambulatory Visit: Payer: Medicaid Other | Admitting: Gastroenterology

## 2022-12-03 ENCOUNTER — Telehealth: Payer: Self-pay | Admitting: *Deleted

## 2022-12-03 NOTE — Telephone Encounter (Unsigned)
Pt had 02/06/22 GI referral for colonoscopy- chart review indicates pt talked to Cory Lewis about scheduling challenges - initial GI appt r/s from 09/28/22 to 10/30/22, and then pt n/s 11/27/22 appt. Unable to reach pt by phone X2 - Pt has upcoming appt with Cory Lewis on 12/10/22 at 2:30P so in-basket message sent to office today and letter mailed to pt today to f/u with Cory Lewis re: his colonoscopy. Pt can then try to r/s appt with [facility] or he and PCP can agree on another referral or alternate CRC screening option per PCP recommendations.

## 2022-12-04 ENCOUNTER — Other Ambulatory Visit: Payer: Self-pay | Admitting: Family Medicine

## 2022-12-04 DIAGNOSIS — E1169 Type 2 diabetes mellitus with other specified complication: Secondary | ICD-10-CM

## 2022-12-04 DIAGNOSIS — E1165 Type 2 diabetes mellitus with hyperglycemia: Secondary | ICD-10-CM

## 2022-12-04 MED ORDER — DAPAGLIFLOZIN PROPANEDIOL 10 MG PO TABS: 10.0000 mg | ORAL_TABLET | Freq: Every day | ORAL | 6 refills | Status: DC

## 2022-12-04 MED ORDER — OMEPRAZOLE 20 MG PO CPDR: 20.0000 mg | DELAYED_RELEASE_CAPSULE | Freq: Every day | ORAL | 6 refills | Status: DC

## 2022-12-04 MED ORDER — GLIPIZIDE ER 10 MG PO TB24: 20.0000 mg | ORAL_TABLET | Freq: Every evening | ORAL | 6 refills | Status: DC

## 2022-12-04 MED ORDER — METFORMIN HCL ER 500 MG PO TB24: 2000.0000 mg | ORAL_TABLET | Freq: Every evening | ORAL | 6 refills | Status: DC

## 2022-12-04 MED ORDER — ATORVASTATIN CALCIUM 40 MG PO TABS: 40.0000 mg | ORAL_TABLET | Freq: Every day | ORAL | 6 refills | Status: DC

## 2022-12-05 ENCOUNTER — Telehealth: Payer: Self-pay | Admitting: *Deleted

## 2022-12-05 ENCOUNTER — Telehealth: Payer: Self-pay

## 2022-12-05 NOTE — Telephone Encounter (Signed)
NOTED

## 2022-12-05 NOTE — Telephone Encounter (Signed)
Patient states he canceled the apt he had on  3/19 b/c he was not able to get off work.  Advised patient to call office to see if there  are any cancellations. He was advised that his 30 day Rx that was given on yesterday would be completed by June and blood sugars would possibly elevated.   Patient verbalized understanding. Will call office for a cancellation and will check MyChart for an appointment.

## 2022-12-05 NOTE — Telephone Encounter (Signed)
-----   Message from Hulan Fray, RN sent at 12/05/2022 12:50 PM EDT ----- Regarding: colon cancer screening f/u Pt has appt with Dr. Margarita Rana 12/10/22 -  please be aware, pt r/s [facility] pre-colonoscopy appt X 2 (09/28/22, 10/30/22) and then no-showed his 11/27/22 appt with Ms Zehr @ [facility]  do not know if [facility] still able to use original [facility] colonoscopy referral and can pt r/s pre-procedure appt.  Also ? if pt willing to have procedure or maybe willing to do another screening. Thanks, Delight Stare

## 2022-12-10 ENCOUNTER — Ambulatory Visit: Payer: Commercial Managed Care - HMO | Admitting: Family Medicine

## 2022-12-24 DIAGNOSIS — Z419 Encounter for procedure for purposes other than remedying health state, unspecified: Secondary | ICD-10-CM | POA: Diagnosis not present

## 2023-01-14 ENCOUNTER — Ambulatory Visit: Payer: Medicaid Other | Attending: Family Medicine | Admitting: Family Medicine

## 2023-01-14 VITALS — BP 136/86 | HR 55 | Wt 240.0 lb

## 2023-01-14 DIAGNOSIS — K219 Gastro-esophageal reflux disease without esophagitis: Secondary | ICD-10-CM

## 2023-01-14 DIAGNOSIS — G4733 Obstructive sleep apnea (adult) (pediatric): Secondary | ICD-10-CM | POA: Diagnosis not present

## 2023-01-14 DIAGNOSIS — E1165 Type 2 diabetes mellitus with hyperglycemia: Secondary | ICD-10-CM

## 2023-01-14 LAB — POCT GLYCOSYLATED HEMOGLOBIN (HGB A1C): HbA1c, POC (controlled diabetic range): 8.4 % — AB (ref 0.0–7.0)

## 2023-01-14 LAB — GLUCOSE, POCT (MANUAL RESULT ENTRY): POC Glucose: 163 mg/dl — AB (ref 70–99)

## 2023-01-14 MED ORDER — ONETOUCH VERIO VI STRP: ORAL_STRIP | 11 refills | Status: AC

## 2023-01-14 MED ORDER — ONETOUCH DELICA LANCETS 33G MISC: 11 refills | Status: AC

## 2023-01-14 NOTE — Patient Instructions (Signed)

## 2023-01-14 NOTE — Progress Notes (Signed)
Subjective:  Patient ID: Cory Lewis, male    DOB: [birth year]  Age: 47 y.o. MRN: [id number]  CC: Medication Refill and Diabetes   HPI Cory Lewis is a 47 y.o. year old male with a history of type 2 diabetes mellitus (A1c 8.4), OSA (on CPAP), GERD who presents today for a follow-up visit.   Interval History: A1c is 8.4 up from 7.5 previously.  He endorses being out of his medication for about 1 month.  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  He has no hypoglycemia, no visual concerns or neuropathy. He does not check his sugars regularly as he lost his meter Reflux symptoms are controlled on omeprazole. He has no additional concerns today.  Past Medical History:  Diagnosis Date   Asthma    DDD (degenerative disc disease), thoracic    Diabetes mellitus without complication    OSA (obstructive sleep apnea)     Past Surgical History:  Procedure Laterality Date   NO PAST SURGERIES      Family History  Problem Relation Age of Onset   Diabetes Mother    Diabetes Father     Social History   Socioeconomic History   Marital status: Married    Spouse name: Not on file   Number of children: Not on file   Years of education: Not on file   Highest education level: 9th grade  Occupational History   Not on file  Tobacco Use   Smoking status: Never   Smokeless tobacco: Never  Substance and Sexual Activity   Alcohol use: No   Drug use: No   Sexual activity: Not Currently  Other Topics Concern   Not on file  Social History Narrative   Not on file   Social Determinants of Health   Financial Resource Strain: Medium Risk (01/14/2023)   Overall Financial Resource Strain (CARDIA)    Difficulty of Paying Living Expenses: Somewhat hard  Food Insecurity: Food Insecurity Present (01/14/2023)   Hunger Vital Sign    Worried About Running Out of Food in the Last Year: Sometimes true    Ran Out of Food  in the Last Year: Sometimes true  Transportation Needs: No Transportation Needs (01/14/2023)   PRAPARE - Administrator, Civil Service (Medical): No    Lack of Transportation (Non-Medical): No  Physical Activity: Unknown (01/14/2023)   Exercise Vital Sign    Days of Exercise per Week: 0 days    Minutes of Exercise per Session: Not on file  Stress: No Stress Concern Present (01/14/2023)   [employer] of Occupational Health - Occupational Stress Questionnaire    Feeling of Stress : Only a little  Social Connections: Unknown (01/14/2023)   Social Connection and Isolation Panel [NHANES]    Frequency of Communication with Friends and Family: Three times a week    Frequency of Social Gatherings with Friends and Family: Patient declined    Attends Religious Services: Patient declined    Database administrator or Organizations: No    Attends Engineer, structural: Not on file    Marital Status: Married    No Known Allergies  Outpatient Medications Prior to Visit  Medication Sig Dispense Refill   atorvastatin (LIPITOR) 40 MG tablet Take 1 tablet (40 mg total) by mouth daily. 30 tablet 6   dapagliflozin propanediol (FARXIGA) 10 MG TABS tablet Take 1 tablet (10 mg total) by  mouth daily before breakfast. 30 tablet 6   glipiZIDE (GLUCOTROL XL) 10 MG 24 hr tablet Take 2 tablets (20 mg total) by mouth every evening. 60 tablet 6   ibuprofen (ADVIL,MOTRIN) 200 MG tablet Take 2 tablets (400 mg total) by mouth every 6 (six) hours as needed for moderate pain. (Patient not taking: Reported on 09/05/2022) 30 tablet 0   metFORMIN (GLUCOPHAGE XR) 500 MG 24 hr tablet Take 4 tablets (2,000 mg total) by mouth every evening. 120 tablet 6   Misc. Devices MISC Home sleep study.  Diagnosis-obstructive sleep apnea 1 each 0   Misc. Devices MISC AutoPAP 5-15cm H20. Diagnosis - Obstructive Sleep Apnea 1 each 0   omeprazole (PRILOSEC) 20 MG capsule Take 1 capsule (20 mg total) by mouth daily. 30  capsule 6   Blood Glucose Monitoring Suppl (ONETOUCH VERIO) w/Device KIT Use as instructed to check blood sugar daily. E11.65 (Patient not taking: Reported on 09/05/2022) 1 kit 0   Continuous Blood Gluc Receiver (DEXCOM G6 RECEIVER) DEVI Use to check blood sugar three times daily. E11.65 (Patient not taking: Reported on 09/05/2022) 1 each 0   Continuous Blood Gluc Sensor (DEXCOM G6 SENSOR) MISC Use to check blood sugar three times daily. Change sensor once every 10 days. E11.65 (Patient not taking: Reported on 09/05/2022) 3 each 2   Continuous Blood Gluc Transmit (DEXCOM G6 TRANSMITTER) MISC Use to check blood sugar three times daily. Change transmitter once every 90 days. E11.65 (Patient not taking: Reported on 09/05/2022) 1 each 1   glucose blood (ONETOUCH VERIO) test strip Use as instructed to check blood sugar daily. E11.65 (Patient not taking: Reported on 09/05/2022) 100 each 11   OneTouch Delica Lancets 33G MISC Use as instructed to check blood sugar daily. E11.65 (Patient not taking: Reported on 09/05/2022) 100 each 11   terbinafine (LAMISIL) 250 MG tablet Take 1 tablet (250 mg total) by mouth daily. (Patient not taking: Reported on 09/05/2022) 30 tablet 1   No facility-administered medications prior to visit.     ROS Review of Systems  Constitutional:  Negative for activity change and appetite change.  HENT:  Negative for sinus pressure and sore throat.   Respiratory:  Negative for chest tightness, shortness of breath and wheezing.   Cardiovascular:  Negative for chest pain and palpitations.  Gastrointestinal:  Negative for abdominal distention, abdominal pain and constipation.  Genitourinary: Negative.   Musculoskeletal: Negative.   Psychiatric/Behavioral:  Negative for behavioral problems and dysphoric mood.     Objective:  BP (!) 144/87 (BP Location: Left Arm, Patient Position: Sitting, Cuff Size: Normal)   Pulse (!) 55   Wt 240 lb (108.9 kg)   SpO2 97%   BMI 42.51 kg/m       01/14/2023    1:48 PM 09/05/2022    2:57 PM 09/05/2022    2:14 PM  BP/Weight  Systolic BP 144 138 151  Diastolic BP 87 88 84  Wt. (Lbs) 240  242.6  BMI 42.51 kg/m2  42.97 kg/m2      Physical Exam Constitutional:      Appearance: He is well-developed.  Cardiovascular:     Rate and Rhythm: Bradycardia present.     Heart sounds: Normal heart sounds. No murmur heard. Pulmonary:     Effort: Pulmonary effort is normal.     Breath sounds: Normal breath sounds. No wheezing or rales.  Chest:     Chest wall: No tenderness.  Abdominal:     General: Bowel sounds are normal.  There is no distension.     Palpations: Abdomen is soft. There is no mass.     Tenderness: There is no abdominal tenderness.  Musculoskeletal:        General: Normal range of motion.     Right lower leg: No edema.     Left lower leg: No edema.  Neurological:     Mental Status: He is alert and oriented to person, place, and time.  Psychiatric:        Mood and Affect: Mood normal.        Latest Ref Rng & Units 06/08/2022   11:06 AM 02/06/2022    3:11 PM 04/29/2020    4:23 PM  CMP  Glucose 70 - 99 mg/dL 981  191  478   BUN 6 - 24 mg/dL Creatinine 0.76 - 1.27 mg/dL 2.95  6.21  3.08   Sodium 134 - 144 mmol/L 137  138  139   Potassium 3.5 - 5.2 mmol/L 4.2  4.8  4.3   Chloride 96 - 106 mmol/L 95  98  99   CO2 20 - 29 mmol/L Calcium 8.7 - 10.2 mg/dL 9.9  9.9  9.5   Total Protein 6.0 - 8.5 g/dL 7.3  7.3  7.3   Total Bilirubin 0.0 - 1.2 mg/dL 0.3  <6.5  <7.8   Alkaline Phos 44 - 121 IU/L 68  57  58   AST 0 - 40 IU/L ALT 0 - 44 IU/L 32  39  28     Lipid Panel     Component Value Date/Time   CHOL 130 06/08/2022 1106   TRIG 101 06/08/2022 1106   HDL 48 06/08/2022 1106   CHOLHDL 3.7 04/29/2020 1623   LDLCALC 63 06/08/2022 1106    CBC    Component Value Date/Time   WBC 6.5 06/08/2022 1106   WBC 4.9 08/08/2014 1850   RBC 4.54 06/08/2022 1106   RBC 4.36  08/08/2014 1850   HGB 13.9 06/08/2022 1106   HCT 40.3 06/08/2022 1106   PLT 321 06/08/2022 1106   MCV 89 06/08/2022 1106   MCH 30.6 06/08/2022 1106   MCH 29.6 08/08/2014 1850   MCHC 34.5 06/08/2022 1106   MCHC 33.2 08/08/2014 1850   RDW 12.1 06/08/2022 1106   LYMPHSABS 2.4 06/08/2022 1106   MONOABS 0.4 08/08/2014 1850   EOSABS 0.0 06/08/2022 1106   BASOSABS 0.0 06/08/2022 1106    Lab Results  Component Value Date   HGBA1C 8.4 (A) 01/14/2023    Assessment & Plan:  1. Type 2 diabetes mellitus with hyperglycemia, without long-term current use of insulin Uncontrolled with A1c of 8.4 up from 7.5 previously, goal is less than 7.0 He has been out of medications for 1 month and is resisting medication change today but would like to work on adherence and lifestyle Will reassess at next visit Counseled on Diabetic diet, my plate method, 469 minutes of moderate intensity exercise/week Blood sugar logs with fasting goals of 80-120 mg/dl, random of less than 629 and in the event of sugars less than 60 mg/dl or greater than 528 mg/dl encouraged to notify the clinic. Advised on the need for annual eye exams, annual foot exams, Pneumonia vaccine. - POCT glucose (manual entry) - POCT glycosylated hemoglobin (Hb A1C) - Microalbumin/Creatinine Ratio, Urine - CMP14+EGFR - glucose blood (ONETOUCH VERIO) test strip; Use as instructed  to check blood sugar daily. E11.65  Dispense: 100 each; Refill: 11 - OneTouch Delica Lancets 33G MISC; Use as instructed to check blood sugar daily. E11.65  Dispense: 100 each; Refill: 11  2. OSA (obstructive sleep apnea) Stable on CPAP  3. Gastroesophageal reflux disease without esophagitis Controlled Discussed side effects of PPI and advised to use PPI as needed Advised to avoid recumbency up to 2 hours postmeal, avoid late meals, avoid foods that trigger symptoms.    Health Care Maintenance: I had referred him for colonoscopy but he was not able to make it.   He plans to call to reschedule.  Meds ordered this encounter  Medications   glucose blood (ONETOUCH VERIO) test strip    Sig: Use as instructed to check blood sugar daily. E11.65    Dispense:  100 each    Refill:  11   OneTouch Delica Lancets 33G MISC    Sig: Use as instructed to check blood sugar daily. E11.65    Dispense:  100 each    Refill:  11    Follow-up: Return in about 3 months (around 04/15/2023) for Chronic medical conditions.       Hoy Register, MD, FAAFP. [facility] [place], [place] [phone]   01/14/2023, 2:13 PM

## 2023-01-15 LAB — CMP14+EGFR
ALT: 20 IU/L (ref 0–44)
AST: 16 IU/L (ref 0–40)
Albumin/Globulin Ratio: 1.9 (ref 1.2–2.2)
Albumin: 4.6 g/dL (ref 4.1–5.1)
Alkaline Phosphatase: 64 IU/L (ref 44–121)
BUN/Creatinine Ratio: 10 (ref 9–20)
BUN: 11 mg/dL (ref 6–24)
Bilirubin Total: <0.2 mg/dL (ref 0.0–1.2)
CO2: 22 mmol/L (ref 20–29)
Calcium: 9.9 mg/dL (ref 8.7–10.2)
Chloride: 101 mmol/L (ref 96–106)
Creatinine, Ser: 1.1 mg/dL (ref 0.76–1.27)
Globulin, Total: 2.4 g/dL (ref 1.5–4.5)
Glucose: 135 mg/dL — ABNORMAL HIGH (ref 70–99)
Potassium: 4.5 mmol/L (ref 3.5–5.2)
Sodium: 141 mmol/L (ref 134–144)
Total Protein: 7 g/dL (ref 6.0–8.5)
eGFR: 84 mL/min/{1.73_m2} (ref >59)

## 2023-01-15 LAB — MICROALBUMIN / CREATININE URINE RATIO
Creatinine, Urine: 127.8 mg/dL
Microalb/Creat Ratio: 5 mg/g creat (ref 0–29)
Microalbumin, Urine: 6 ug/mL

## 2023-01-23 DIAGNOSIS — Z419 Encounter for procedure for purposes other than remedying health state, unspecified: Secondary | ICD-10-CM | POA: Diagnosis not present

## 2023-02-07 DIAGNOSIS — H5213 Myopia, bilateral: Secondary | ICD-10-CM | POA: Diagnosis not present

## 2023-02-07 LAB — HM DIABETES EYE EXAM

## 2023-02-23 DIAGNOSIS — Z419 Encounter for procedure for purposes other than remedying health state, unspecified: Secondary | ICD-10-CM | POA: Diagnosis not present

## 2023-02-25 ENCOUNTER — Ambulatory Visit: Payer: Commercial Managed Care - HMO | Admitting: Family Medicine

## 2023-03-09 ENCOUNTER — Encounter: Payer: Self-pay | Admitting: Family Medicine

## 2023-03-12 ENCOUNTER — Other Ambulatory Visit: Payer: Self-pay | Admitting: Family Medicine

## 2023-03-12 DIAGNOSIS — E1165 Type 2 diabetes mellitus with hyperglycemia: Secondary | ICD-10-CM

## 2023-03-12 MED ORDER — DAPAGLIFLOZIN PROPANEDIOL 10 MG PO TABS: 10.0000 mg | ORAL_TABLET | Freq: Every day | ORAL | 1 refills | Status: DC

## 2023-03-25 DIAGNOSIS — Z419 Encounter for procedure for purposes other than remedying health state, unspecified: Secondary | ICD-10-CM | POA: Diagnosis not present

## 2023-03-26 DIAGNOSIS — H524 Presbyopia: Secondary | ICD-10-CM | POA: Diagnosis not present

## 2023-04-15 ENCOUNTER — Ambulatory Visit: Payer: BLUE CROSS/BLUE SHIELD | Attending: Family Medicine | Admitting: Family Medicine

## 2023-04-15 ENCOUNTER — Encounter: Payer: Self-pay | Admitting: Family Medicine

## 2023-04-15 VITALS — BP 125/85 | HR 82 | Temp 98.4°F | Ht 63.0 in | Wt 233.0 lb

## 2023-04-15 DIAGNOSIS — Z7984 Long term (current) use of oral hypoglycemic drugs: Secondary | ICD-10-CM

## 2023-04-15 DIAGNOSIS — E1169 Type 2 diabetes mellitus with other specified complication: Secondary | ICD-10-CM | POA: Diagnosis not present

## 2023-04-15 DIAGNOSIS — K219 Gastro-esophageal reflux disease without esophagitis: Secondary | ICD-10-CM

## 2023-04-15 DIAGNOSIS — E785 Hyperlipidemia, unspecified: Secondary | ICD-10-CM

## 2023-04-15 DIAGNOSIS — Z1211 Encounter for screening for malignant neoplasm of colon: Secondary | ICD-10-CM | POA: Diagnosis not present

## 2023-04-15 DIAGNOSIS — E1165 Type 2 diabetes mellitus with hyperglycemia: Secondary | ICD-10-CM

## 2023-04-15 LAB — POCT GLYCOSYLATED HEMOGLOBIN (HGB A1C): HbA1c, POC (controlled diabetic range): 8.4 % — AB (ref 0.0–7.0)

## 2023-04-15 LAB — GLUCOSE, POCT (MANUAL RESULT ENTRY): POC Glucose: 146 mg/dl — AB (ref 70–99)

## 2023-04-15 MED ORDER — DAPAGLIFLOZIN PROPANEDIOL 10 MG PO TABS
10.0000 mg | ORAL_TABLET | Freq: Every day | ORAL | 1 refills | Status: DC
Start: 2023-04-15 — End: 2023-08-08

## 2023-04-15 MED ORDER — ATORVASTATIN CALCIUM 40 MG PO TABS
40.0000 mg | ORAL_TABLET | Freq: Every day | ORAL | 1 refills | Status: DC
Start: 2023-04-15 — End: 2023-08-08

## 2023-04-15 MED ORDER — GLIPIZIDE ER 10 MG PO TB24
20.0000 mg | ORAL_TABLET | Freq: Every evening | ORAL | 1 refills | Status: DC
Start: 2023-04-15 — End: 2023-08-08

## 2023-04-15 MED ORDER — METFORMIN HCL ER 500 MG PO TB24
2000.0000 mg | ORAL_TABLET | Freq: Every evening | ORAL | 1 refills | Status: DC
Start: 2023-04-15 — End: 2023-08-08

## 2023-04-15 MED ORDER — OMEPRAZOLE 20 MG PO CPDR
20.0000 mg | DELAYED_RELEASE_CAPSULE | Freq: Every day | ORAL | 1 refills | Status: DC
Start: 2023-04-15 — End: 2023-08-08

## 2023-04-15 NOTE — Progress Notes (Signed)
Subjective:  Patient ID: Cory Lewis, male    DOB: [birth year]  Age: 47 y.o. MRN: [id number]  CC: Diabetes   HPI Cory Lewis is a 47 y.o. year old male with a history of type 2 diabetes mellitus (A1c 8.4), OSA (on CPAP), GERD who presents today for a follow-up visit.   Interval History: Discussed the use of AI scribe software for clinical note transcription with the patient, who gave verbal consent to proceed.   The patient, with a history of diabetes, presents for a follow-up appointment. He reports running out of his glipizide medication a few days ago and is awaiting a refill. He continues to take [place] and metformin. Despite adherence to his medication regimen, his A1c levels have remained at 8.4, unchanged from his previous visit. The patient has been taking one glipizide pill instead of the prescribed two. He has also been taking one Farxiga pill and four metformin pills daily (total of 2000mg ) , two in the morning and two in the afternoon. 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 He has recently had an eye exam and is due for a colonoscopy.        Past Medical History:  Diagnosis Date   Asthma    DDD (degenerative disc disease), thoracic    Diabetes mellitus without complication (HCC)    OSA (obstructive sleep apnea)     Past Surgical History:  Procedure Laterality Date   NO PAST SURGERIES      Family History  Problem Relation Age of Onset   Diabetes Mother    Diabetes Father     Social History   Socioeconomic History   Marital status: Married    Spouse name: Not on file   Number of children: Not on file   Years of education: Not on file   Highest education level: 9th grade  Occupational History   Not on file  Tobacco Use   Smoking status: Never   Smokeless tobacco: Never  Substance and Sexual Activity   Alcohol use: No   Drug use: No   Sexual activity: Not Currently  Other Topics Concern   Not on file  Social History  Narrative   Not on file   Social Determinants of Health   Financial Resource Strain: Medium Risk (01/14/2023)   Overall Financial Resource Strain (CARDIA)    Difficulty of Paying Living Expenses: Somewhat hard  Food Insecurity: Food Insecurity Present (01/14/2023)   Hunger Vital Sign    Worried About Running Out of Food in the Last Year: Sometimes true    Ran Out of Food in the Last Year: Sometimes true  Transportation Needs: No Transportation Needs (01/14/2023)   PRAPARE - Administrator, Civil Service (Medical): No    Lack of Transportation (Non-Medical): No  Physical Activity: Unknown (01/14/2023)   Exercise Vital Sign    Days of Exercise per Week: 0 days    Minutes of Exercise per Session: Not on file  Stress: No Stress Concern Present (01/14/2023)   [employer] of Occupational Health - Occupational Stress Questionnaire    Feeling of Stress : Only a little  Social Connections: Unknown (01/14/2023)   Social Connection and Isolation Panel [NHANES]    Frequency of Communication with Friends and Family: Three times a week    Frequency of Social Gatherings with Friends and Family: Patient declined    Attends Religious Services: Patient declined    Active Member of Clubs or Organizations: No  Attends Banker Meetings: Not on file    Marital Status: Married    No Known Allergies  Outpatient Medications Prior to Visit  Medication Sig Dispense Refill   glucose blood (ONETOUCH VERIO) test strip Use as instructed to check blood sugar daily. E11.65 100 each 11   ibuprofen (ADVIL,MOTRIN) 200 MG tablet Take 2 tablets (400 mg total) by mouth every 6 (six) hours as needed for moderate pain. 30 tablet 0   Misc. Devices MISC Home sleep study.  Diagnosis-obstructive sleep apnea 1 each 0   Misc. Devices MISC AutoPAP 5-15cm H20. Diagnosis - Obstructive Sleep Apnea 1 each 0   OneTouch Delica Lancets 33G MISC Use as instructed to check blood sugar daily. E11.65 100  each 11   atorvastatin (LIPITOR) 40 MG tablet Take 1 tablet (40 mg total) by mouth daily. 30 tablet 6   dapagliflozin propanediol (FARXIGA) 10 MG TABS tablet Take 1 tablet (10 mg total) by mouth daily before breakfast. 90 tablet 1   glipiZIDE (GLUCOTROL XL) 10 MG 24 hr tablet Take 2 tablets (20 mg total) by mouth every evening. 60 tablet 6   metFORMIN (GLUCOPHAGE XR) 500 MG 24 hr tablet Take 4 tablets (2,000 mg total) by mouth every evening. 120 tablet 6   omeprazole (PRILOSEC) 20 MG capsule Take 1 capsule (20 mg total) by mouth daily. 30 capsule 6   No facility-administered medications prior to visit.     ROS Review of Systems  Constitutional:  Negative for activity change and appetite change.  HENT:  Negative for sinus pressure and sore throat.   Respiratory:  Negative for chest tightness, shortness of breath and wheezing.   Cardiovascular:  Negative for chest pain and palpitations.  Gastrointestinal:  Negative for abdominal distention, abdominal pain and constipation.  Genitourinary: Negative.   Musculoskeletal: Negative.   Psychiatric/Behavioral:  Negative for behavioral problems and dysphoric mood.     Objective:  BP 125/85   Pulse 82   Temp 98.4 F (36.9 C) (Oral)   Ht 5\' 3"  (1.6 m)   Wt 233 lb (105.7 kg)   SpO2 95%   BMI 41.27 kg/m      04/15/2023    1:45 PM 01/14/2023    2:15 PM 01/14/2023    1:48 PM  BP/Weight  Systolic BP 125 136 144  Diastolic BP 85 86 87  Wt. (Lbs) 233  240  BMI 41.27 kg/m2  42.51 kg/m2      Physical Exam Constitutional:      Appearance: He is well-developed.  Cardiovascular:     Rate and Rhythm: Normal rate.     Heart sounds: Normal heart sounds. No murmur heard. Pulmonary:     Effort: Pulmonary effort is normal.     Breath sounds: Normal breath sounds. No wheezing or rales.  Chest:     Chest wall: No tenderness.  Abdominal:     General: Bowel sounds are normal. There is no distension.     Palpations: Abdomen is soft. There is no  mass.     Tenderness: There is no abdominal tenderness.  Musculoskeletal:        General: Normal range of motion.     Right lower leg: No edema.     Left lower leg: No edema.  Neurological:     Mental Status: He is alert and oriented to person, place, and time.  Psychiatric:        Mood and Affect: Mood normal.        Latest  Ref Rng & Units 01/14/2023    2:22 PM 06/08/2022   11:06 AM 02/06/2022    3:11 PM  CMP  Glucose 70 - 99 mg/dL 409  811  914   BUN 6 - 24 mg/dL 11  17  15    Creatinine 0.76 - 1.27 mg/dL 7.82  9.56  2.13   Sodium 134 - 144 mmol/L 141  137  138   Potassium 3.5 - 5.2 mmol/L 4.5  4.2  4.8   Chloride 96 - 106 mmol/L 101  95  98   CO2 20 - 29 mmol/L 22  22  23    Calcium 8.7 - 10.2 mg/dL 9.9  9.9  9.9   Total Protein 6.0 - 8.5 g/dL 7.0  7.3  7.3   Total Bilirubin 0.0 - 1.2 mg/dL <0.8  0.3  <6.5   Alkaline Phos 44 - 121 IU/L 64  68  57   AST 0 - 40 IU/L 16  28  25    ALT 0 - 44 IU/L 20  32  39     Lipid Panel     Component Value Date/Time   CHOL 130 06/08/2022 1106   TRIG 101 06/08/2022 1106   HDL 48 06/08/2022 1106   CHOLHDL 3.7 04/29/2020 1623   LDLCALC 63 06/08/2022 1106    CBC    Component Value Date/Time   WBC 6.5 06/08/2022 1106   WBC 4.9 08/08/2014 1850   RBC 4.54 06/08/2022 1106   RBC 4.36 08/08/2014 1850   HGB 13.9 06/08/2022 1106   HCT 40.3 06/08/2022 1106   PLT 321 06/08/2022 1106   MCV 89 06/08/2022 1106   MCH 30.6 06/08/2022 1106   MCH 29.6 08/08/2014 1850   MCHC 34.5 06/08/2022 1106   MCHC 33.2 08/08/2014 1850   RDW 12.1 06/08/2022 1106   LYMPHSABS 2.4 06/08/2022 1106   MONOABS 0.4 08/08/2014 1850   EOSABS 0.0 06/08/2022 1106   BASOSABS 0.0 06/08/2022 1106    Lab Results  Component Value Date   HGBA1C 8.4 (A) 04/15/2023    Assessment & Plan:      Type 2 Diabetes Mellitus: A1c increased from 7.5 to 8.4 over the past few months despite maximum doses of Farxiga and Metformin. Patient has been inconsistently taking Glipizide  10mg  instead of the prescribed 20mg  daily and was out of medication for 2-3 days. -Increase Glipizide to 20mg  daily as prescribed. -Continue Farxiga and Metformin at current doses -Check A1c in 3 months to assess response to increased Glipizide dose.  Hyperlipidemia associated with Type 2 DM: Controlled -Continue statin -Low cholesterol diet   GERD: controlled on PPI -Advised to avoid recumbency up to 2 hours postmeal, avoid late meals, avoid foods that trigger symptoms.    General Health Maintenance: -Colonoscopy referral to be placed again as patient has not received a call for scheduling. -Continue annual diabetic eye exams (last completed Feb 07, 2023).         Meds ordered this encounter  Medications   atorvastatin (LIPITOR) 40 MG tablet    Sig: Take 1 tablet (40 mg total) by mouth daily.    Dispense:  90 tablet    Refill:  1   dapagliflozin propanediol (FARXIGA) 10 MG TABS tablet    Sig: Take 1 tablet (10 mg total) by mouth daily before breakfast.    Dispense:  90 tablet    Refill:  1   glipiZIDE (GLUCOTROL XL) 10 MG 24 hr tablet    Sig: Take 2 tablets (  20 mg total) by mouth every evening.    Dispense:  180 tablet    Refill:  1   metFORMIN (GLUCOPHAGE XR) 500 MG 24 hr tablet    Sig: Take 4 tablets (2,000 mg total) by mouth every evening.    Dispense:  360 tablet    Refill:  1   omeprazole (PRILOSEC) 20 MG capsule    Sig: Take 1 capsule (20 mg total) by mouth daily.    Dispense:  90 capsule    Refill:  1    Follow-up: Return in about 3 months (around 07/16/2023) for Chronic medical conditions.       Hoy Register, MD, FAAFP. [facility] and [facility] [place], [place] [phone]   04/15/2023, 3:08 PM

## 2023-04-15 NOTE — Patient Instructions (Signed)

## 2023-04-25 DIAGNOSIS — Z419 Encounter for procedure for purposes other than remedying health state, unspecified: Secondary | ICD-10-CM | POA: Diagnosis not present

## 2023-05-21 ENCOUNTER — Other Ambulatory Visit: Payer: Self-pay | Admitting: Family Medicine

## 2023-05-21 ENCOUNTER — Encounter: Payer: Self-pay | Admitting: Family Medicine

## 2023-05-21 DIAGNOSIS — Z1211 Encounter for screening for malignant neoplasm of colon: Secondary | ICD-10-CM

## 2023-05-26 DIAGNOSIS — Z419 Encounter for procedure for purposes other than remedying health state, unspecified: Secondary | ICD-10-CM | POA: Diagnosis not present

## 2023-06-03 ENCOUNTER — Encounter: Payer: Self-pay | Admitting: Family Medicine

## 2023-06-06 ENCOUNTER — Telehealth: Payer: Self-pay | Admitting: Family Medicine

## 2023-06-06 ENCOUNTER — Telehealth: Payer: Self-pay

## 2023-06-06 NOTE — Telephone Encounter (Signed)
Pt missed a call - and had to put the caller on hold. Returning call about [facility]. Please advise CB- [phone]

## 2023-06-06 NOTE — Telephone Encounter (Addendum)
Message received from Dr Alvis Lemmings that the patient reports [facility] is requesting documentation about his sleep study.   I called [facility] and spoke to [facility] /Authorization Team: [phone] and inquired what documentation is needed. The patient had a sleep study  05/29/2022 and received his CPAP machine from [facility] on 07/19/2022.   Josh explained that the patient had a rental of the machine for 13 months per his insurance; but they were only paid for 3 months.  Josh was not able to tell me exactly what is needed from the PCP.  I explained that there is no more information about a sleep study. That information was shared with the initial order and the patient has not had another sleep study.   Sharia Reeve was looking through the notes in their record for the patient and was not able to tell me exactly what is needed and why.  He asked if the patient had an upcoming appointment with PCP and I told him yes, 07/16/2023.  Josh then said that the provider should note that the patient continues to use the CPAP machine and benefits from using it.  He could not tell me anything else that is needed from PCP at this time.    I called the patient to inform him of my call with [facility], he answered and then asked me to hold.  The call was on hold for over 6 minutes and I needed to hang up.  I called him back a short time later and had to leave a message requesting he call me back.

## 2023-06-10 NOTE — Telephone Encounter (Signed)
I returned the call to the patient to inform him that I spoke to a representative at [facility] and he  was not able to tell me exactly what is needed and why.  He asked if the patient had an upcoming appointment with PCP and I told him yes, 07/16/2023.  The rep then said that the provider should note that the patient continues to use the CPAP machine and benefits from using it.  He could not tell me anything else that is needed from PCP at this time.  The patient said he understood and I told him to please call this office if he receives any inquiries from [employer] and he said he would

## 2023-06-25 DIAGNOSIS — Z419 Encounter for procedure for purposes other than remedying health state, unspecified: Secondary | ICD-10-CM | POA: Diagnosis not present

## 2023-07-16 ENCOUNTER — Ambulatory Visit: Payer: BLUE CROSS/BLUE SHIELD | Admitting: Family Medicine

## 2023-07-26 DIAGNOSIS — Z419 Encounter for procedure for purposes other than remedying health state, unspecified: Secondary | ICD-10-CM | POA: Diagnosis not present

## 2023-08-08 ENCOUNTER — Ambulatory Visit: Payer: BLUE CROSS/BLUE SHIELD | Attending: Physician Assistant | Admitting: Physician Assistant

## 2023-08-08 ENCOUNTER — Encounter: Payer: Self-pay | Admitting: Physician Assistant

## 2023-08-08 VITALS — BP 135/83 | HR 75 | Ht 63.0 in | Wt 230.4 lb

## 2023-08-08 DIAGNOSIS — E1169 Type 2 diabetes mellitus with other specified complication: Secondary | ICD-10-CM | POA: Diagnosis not present

## 2023-08-08 DIAGNOSIS — K219 Gastro-esophageal reflux disease without esophagitis: Secondary | ICD-10-CM

## 2023-08-08 DIAGNOSIS — E1165 Type 2 diabetes mellitus with hyperglycemia: Secondary | ICD-10-CM

## 2023-08-08 DIAGNOSIS — Z7984 Long term (current) use of oral hypoglycemic drugs: Secondary | ICD-10-CM

## 2023-08-08 DIAGNOSIS — E785 Hyperlipidemia, unspecified: Secondary | ICD-10-CM

## 2023-08-08 DIAGNOSIS — Z91199 Patient's noncompliance with other medical treatment and regimen due to unspecified reason: Secondary | ICD-10-CM

## 2023-08-08 LAB — POCT GLYCOSYLATED HEMOGLOBIN (HGB A1C): HbA1c, POC (controlled diabetic range): 10.4 % — AB (ref 0.0–7.0)

## 2023-08-08 MED ORDER — METFORMIN HCL ER 500 MG PO TB24: 2000.0000 mg | ORAL_TABLET | Freq: Every evening | ORAL | 1 refills | Status: DC

## 2023-08-08 MED ORDER — GLIPIZIDE ER 10 MG PO TB24: 20.0000 mg | ORAL_TABLET | Freq: Every evening | ORAL | 1 refills | Status: DC

## 2023-08-08 MED ORDER — ATORVASTATIN CALCIUM 40 MG PO TABS
40.0000 mg | ORAL_TABLET | Freq: Every day | ORAL | 1 refills | Status: DC
Start: 2023-08-08 — End: 2023-12-18

## 2023-08-08 MED ORDER — OMEPRAZOLE 20 MG PO CPDR: 20.0000 mg | DELAYED_RELEASE_CAPSULE | Freq: Every day | ORAL | 1 refills | Status: DC

## 2023-08-08 MED ORDER — DAPAGLIFLOZIN PROPANEDIOL 10 MG PO TABS: 10.0000 mg | ORAL_TABLET | Freq: Every day | ORAL | 1 refills | Status: DC

## 2023-08-08 NOTE — Progress Notes (Signed)
Patient ID: Cory Lewis, male   DOB: 10/20/1975, 47 y.o.   MRN: [id number]   Cory Lewis, is a 47 y.o. male  [id number]  [id number]  DOB - 03/07/1976  Chief Complaint  Patient presents with   Medical Management of Chronic Issues       Subjective:   Cory Lewis is a 47 y.o. male here today for med RF and A1C check.  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  However, he takes his meds less than half the time, does not check blood sugars, and doesn't follow diabetic diet.  His last A1C was in July and was 8.4.  no new issues or concerns.    No problems updated.  ALLERGIES: No Known Allergies  PAST MEDICAL HISTORY: Past Medical History:  Diagnosis Date   Asthma    DDD (degenerative disc disease), thoracic    Diabetes mellitus without complication (HCC)    OSA (obstructive sleep apnea)     MEDICATIONS AT HOME: Prior to Admission medications   Medication Sig Start Date End Date Taking? Authorizing Provider  glucose blood (ONETOUCH VERIO) test strip Use as instructed to check blood sugar daily. E11.65 01/14/23  Yes Hoy Register, MD  ibuprofen (ADVIL,MOTRIN) 200 MG tablet Take 2 tablets (400 mg total) by mouth every 6 (six) hours as needed for moderate pain. 08/08/14  Yes Earley Favor, NP  Misc. Devices MISC Home sleep study.  Diagnosis-obstructive sleep apnea 03/20/22  Yes Hoy Register, MD  Misc. Devices MISC AutoPAP 5-15cm H20. Diagnosis - Obstructive Sleep Apnea 07/09/22  Yes Hoy Register, MD  OneTouch Delica Lancets 33G MISC Use as instructed to check blood sugar daily. E11.65 01/14/23  Yes Hoy Register, MD  atorvastatin (LIPITOR) 40 MG tablet Take 1 tablet (40 mg total) by mouth daily. 08/08/23   Anders Simmonds, PA-C  dapagliflozin propanediol (FARXIGA) 10 MG TABS tablet Take 1 tablet (10 mg total) by mouth daily before breakfast. 08/08/23   Anders Simmonds, PA-C  glipiZIDE (GLUCOTROL XL) 10 MG 24 hr tablet Take 2 tablets (20 mg total) by mouth  every evening. 08/08/23   Anders Simmonds, PA-C  metFORMIN (GLUCOPHAGE XR) 500 MG 24 hr tablet Take 4 tablets (2,000 mg total) by mouth every evening. 08/08/23   Anders Simmonds, PA-C  omeprazole (PRILOSEC) 20 MG capsule Take 1 capsule (20 mg total) by mouth daily. 08/08/23   Abe Schools, Marzella Schlein, PA-C    ROS: Neg HEENT Neg resp Neg cardiac Neg GI Neg GU Neg MS Neg psych Neg neuro  Objective:   Vitals:   08/08/23 1530  BP: 135/83  Pulse: 75  SpO2: 98%  Weight: 230 lb 6.4 oz (104.5 kg)  Height: 5\' 3"  (1.6 m)   Exam General appearance : Awake, alert, not in any distress. Speech Clear. Not toxic looking HEENT: Atraumatic and Normocephalic, pupils equally reactive to light and accomodation Neck: Supple, no JVD. No cervical lymphadenopathy.  Chest: Good air entry bilaterally, CTAB.  No rales/rhonchi/wheezing CVS: S1 S2 regular, no murmurs.  Abdomen: Bowel sounds present, Non tender and not distended with no gaurding, rigidity or rebound. Extremities: B/L Lower Ext shows no edema, both legs are warm to touch Neurology: Awake alert, and oriented X 3, CN II-XII intact, Non focal Skin: No Rash  Data Review Lab Results  Component Value Date   HGBA1C 10.4 (A) 08/08/2023   HGBA1C 8.4 (A) 04/15/2023   HGBA1C 8.4 (A) 01/14/2023    Assessment & Plan  1. Type 2 diabetes mellitus with hyperglycemia, without long-term current use of insulin (HCC) Compliance imperative.   - HgB A1c - dapagliflozin propanediol (FARXIGA) 10 MG TABS tablet; Take 1 tablet (10 mg total) by mouth daily before breakfast.  Dispense: 90 tablet; Refill: 1 - glipiZIDE (GLUCOTROL XL) 10 MG 24 hr tablet; Take 2 tablets (20 mg total) by mouth every evening.  Dispense: 180 tablet; Refill: 1 - metFORMIN (GLUCOPHAGE XR) 500 MG 24 hr tablet; Take 4 tablets (2,000 mg total) by mouth every evening.  Dispense: 360 tablet; Refill: 1 - CMP14+EGFR  2. Long term current use of oral hypoglycemic drug - dapagliflozin  propanediol (FARXIGA) 10 MG TABS tablet; Take 1 tablet (10 mg total) by mouth daily before breakfast.  Dispense: 90 tablet; Refill: 1 - glipiZIDE (GLUCOTROL XL) 10 MG 24 hr tablet; Take 2 tablets (20 mg total) by mouth every evening.  Dispense: 180 tablet; Refill: 1 - metFORMIN (GLUCOPHAGE XR) 500 MG 24 hr tablet; Take 4 tablets (2,000 mg total) by mouth every evening.  Dispense: 360 tablet; Refill: 1 - CMP14+EGFR  3. Hyperlipidemia associated with type 2 diabetes mellitus (HCC) - atorvastatin (LIPITOR) 40 MG tablet; Take 1 tablet (40 mg total) by mouth daily.  Dispense: 90 tablet; Refill: 1 - CMP14+EGFR  4. Gastroesophageal reflux disease without esophagitis - omeprazole (PRILOSEC) 20 MG capsule; Take 1 capsule (20 mg total) by mouth daily.  Dispense: 90 capsule; Refill: 1 - CMP14+EGFR  5. Noncompliance Compliance imperative.  Advised to set timers or alarms to remember all dose of meds.  Check blood sugars and work at a goal of eliminating sugary drinks, candy, desserts, sweets, refined sugars, processed foods, and white carbohydrates.       Return in about 3 months (around 11/08/2023) for PCP for chronic conditions- Dr Alvis Lemmings.  The patient was given clear instructions to go to ER or return to medical center if symptoms don't improve, worsen or new problems develop. The patient verbalized understanding. The patient was told to call to get lab results if they haven't heard anything in the next week.      Georgian Co, PA-C [facility] and [facility]ness [place], [place] [phone]   08/08/2023, 3:57 PM

## 2023-08-08 NOTE — Patient Instructions (Signed)
Work at a goal of eliminating sugary drinks, candy, desserts, sweets, refined sugars, processed foods, and white carbohydrates.

## 2023-08-09 LAB — CMP14+EGFR
ALT: 27 [IU]/L (ref 0–44)
AST: 28 [IU]/L (ref 0–40)
Albumin: 4.5 g/dL (ref 4.1–5.1)
Alkaline Phosphatase: 62 [IU]/L (ref 44–121)
BUN/Creatinine Ratio: 9 (ref 9–20)
BUN: 9 mg/dL (ref 6–24)
Bilirubin Total: <0.2 mg/dL (ref 0.0–1.2)
CO2: 19 mmol/L — ABNORMAL LOW (ref 20–29)
Calcium: 9.6 mg/dL (ref 8.7–10.2)
Chloride: 100 mmol/L (ref 96–106)
Creatinine, Ser: 1 mg/dL (ref 0.76–1.27)
Globulin, Total: 2.6 g/dL (ref 1.5–4.5)
Glucose: 136 mg/dL — ABNORMAL HIGH (ref 70–99)
Potassium: 4.4 mmol/L (ref 3.5–5.2)
Sodium: 139 mmol/L (ref 134–144)
Total Protein: 7.1 g/dL (ref 6.0–8.5)
eGFR: 93 mL/min/{1.73_m2} (ref >59)

## 2023-08-12 ENCOUNTER — Telehealth: Payer: Self-pay

## 2023-08-12 NOTE — Telephone Encounter (Signed)
-----   Message from Georgian Co sent at 08/12/2023  7:46 AM EST ----- Your blood sugar was elevated as we discussed.  Your kidney, liver, and electrolytes were normal.. take medications as directed and follow up as planned.  Thanks, Georgian Co, PA-C

## 2023-08-12 NOTE — Telephone Encounter (Signed)
Pt was called and vm was left, Information has been sent to nurse pool.

## 2023-08-25 DIAGNOSIS — Z419 Encounter for procedure for purposes other than remedying health state, unspecified: Secondary | ICD-10-CM | POA: Diagnosis not present

## 2023-09-03 ENCOUNTER — Telehealth: Payer: Self-pay

## 2023-09-03 NOTE — Telephone Encounter (Signed)
Copied from CRM [id number]. Topic: General - Other >> Sep 03, 2023  4:20 PM Phill Myron wrote: Benita Gutter with [employer] [facility]   --information regarding CPAP machine is being re-faxed. Recent office notes are needed  Fax# [phone]

## 2023-09-06 NOTE — Telephone Encounter (Signed)
Fax has been received and will be faxed once complete. Patient has upcoming appointment for office notes.

## 2023-09-12 ENCOUNTER — Ambulatory Visit: Payer: BLUE CROSS/BLUE SHIELD | Admitting: Physician Assistant

## 2023-09-25 DIAGNOSIS — Z419 Encounter for procedure for purposes other than remedying health state, unspecified: Secondary | ICD-10-CM | POA: Diagnosis not present

## 2023-10-08 ENCOUNTER — Ambulatory Visit: Payer: BLUE CROSS/BLUE SHIELD | Admitting: Family Medicine

## 2023-10-09 ENCOUNTER — Ambulatory Visit: Payer: BLUE CROSS/BLUE SHIELD | Admitting: Physician Assistant

## 2023-10-10 ENCOUNTER — Ambulatory Visit: Payer: BLUE CROSS/BLUE SHIELD | Attending: Family Medicine

## 2023-10-10 DIAGNOSIS — Z7984 Long term (current) use of oral hypoglycemic drugs: Secondary | ICD-10-CM | POA: Diagnosis not present

## 2023-10-10 DIAGNOSIS — E119 Type 2 diabetes mellitus without complications: Secondary | ICD-10-CM | POA: Diagnosis not present

## 2023-10-10 LAB — POCT GLYCOSYLATED HEMOGLOBIN (HGB A1C): HbA1c, POC (controlled diabetic range): 7.6 % — AB (ref 0.0–7.0)

## 2023-10-10 NOTE — Progress Notes (Addendum)
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 Patient was given a copy of results

## 2023-10-26 DIAGNOSIS — Z419 Encounter for procedure for purposes other than remedying health state, unspecified: Secondary | ICD-10-CM | POA: Diagnosis not present

## 2023-11-11 ENCOUNTER — Ambulatory Visit: Payer: Self-pay | Admitting: Family Medicine

## 2023-11-23 DIAGNOSIS — Z419 Encounter for procedure for purposes other than remedying health state, unspecified: Secondary | ICD-10-CM | POA: Diagnosis not present

## 2023-12-18 ENCOUNTER — Encounter: Payer: Self-pay | Admitting: Family Medicine

## 2023-12-18 ENCOUNTER — Ambulatory Visit: Payer: BLUE CROSS/BLUE SHIELD | Attending: Family Medicine | Admitting: Family Medicine

## 2023-12-18 VITALS — BP 168/89 | HR 83 | Ht 63.0 in | Wt 227.8 lb

## 2023-12-18 DIAGNOSIS — R030 Elevated blood-pressure reading, without diagnosis of hypertension: Secondary | ICD-10-CM

## 2023-12-18 DIAGNOSIS — E785 Hyperlipidemia, unspecified: Secondary | ICD-10-CM

## 2023-12-18 DIAGNOSIS — Z7984 Long term (current) use of oral hypoglycemic drugs: Secondary | ICD-10-CM | POA: Diagnosis not present

## 2023-12-18 DIAGNOSIS — E1169 Type 2 diabetes mellitus with other specified complication: Secondary | ICD-10-CM

## 2023-12-18 DIAGNOSIS — E1165 Type 2 diabetes mellitus with hyperglycemia: Secondary | ICD-10-CM

## 2023-12-18 DIAGNOSIS — K219 Gastro-esophageal reflux disease without esophagitis: Secondary | ICD-10-CM

## 2023-12-18 MED ORDER — ATORVASTATIN CALCIUM 40 MG PO TABS: 40.0000 mg | ORAL_TABLET | Freq: Every day | ORAL | 1 refills | Status: AC

## 2023-12-18 MED ORDER — METFORMIN HCL ER 500 MG PO TB24: 2000.0000 mg | ORAL_TABLET | Freq: Every evening | ORAL | 1 refills | Status: AC

## 2023-12-18 MED ORDER — DAPAGLIFLOZIN PROPANEDIOL 10 MG PO TABS: 10.0000 mg | ORAL_TABLET | Freq: Every day | ORAL | 1 refills | Status: AC

## 2023-12-18 MED ORDER — GLIPIZIDE ER 10 MG PO TB24: 20.0000 mg | ORAL_TABLET | Freq: Every evening | ORAL | 1 refills | Status: AC

## 2023-12-18 MED ORDER — OMEPRAZOLE 20 MG PO CPDR: 20.0000 mg | DELAYED_RELEASE_CAPSULE | Freq: Every day | ORAL | 1 refills | Status: AC

## 2023-12-18 NOTE — Progress Notes (Signed)
 Subjective:  Patient ID: Cory Lewis, male    DOB: Apr 23, 1976  Age: 48 y.o. MRN: [id number]  CC: Medical Management of Chronic Issues     Discussed the use of AI scribe software for clinical note transcription with the patient, who gave verbal consent to proceed.  History of Present Illness Cory Lewis, a patient with a history of type 2 diabetes mellitus (A1c 8.4), OSA (on CPAP), GERD  presents for a routine follow-up. Cory Lewis reports no major concerns. However, his blood pressure was noted to be elevated at the start of the visit, which Cory Lewis attributes to recent physical activity and work-related stress. Cory Lewis has been working two jobs and reports a significant amount of stress. Cory Lewis has lost a significant amount of weight, from 246 lbs to 227 lbs over the last 4 years but Cory Lewis states Cory Lewis started at 260 pounds.  Regarding his diabetes, his last A1c was 7.6, down from 10.4. Cory Lewis is currently on glipizide, metformin, and Farxiga for diabetes management. Cory Lewis has not been taking atorvastatin for cholesterol management, but has recently been supplementing with peanuts, which Cory Lewis believes helps.  Last lipid panel was from 2023 and was normal.  Cory Lewis has been referred for a colonoscopy but has not yet scheduled the procedure. Cory Lewis expresses some reluctance about undergoing the procedure.    Past Medical History:  Diagnosis Date   Asthma    DDD (degenerative disc disease), thoracic    Diabetes mellitus without complication (HCC)    OSA (obstructive sleep apnea)     Past Surgical History:  Procedure Laterality Date   NO PAST SURGERIES      Family History  Problem Relation Age of Onset   Diabetes Mother    Diabetes Father     Social History   Socioeconomic History   Marital status: Married    Spouse name: Not on file   Number of children: Not on file   Years of education: Not on file   Highest education level: 9th grade  Occupational History   Not on file  Tobacco Use   Smoking status: Never    Smokeless tobacco: Never  Substance and Sexual Activity   Alcohol use: No   Drug use: No   Sexual activity: Not Currently  Other Topics Concern   Not on file  Social History Narrative   Not on file   Social Drivers of Health   Financial Resource Strain: Medium Risk (09/08/2023)   Overall Financial Resource Strain (CARDIA)    Difficulty of Paying Living Expenses: Somewhat hard  Food Insecurity: Food Insecurity Present (01/14/2023)   Hunger Vital Sign    Worried About Running Out of Food in the Last Year: Sometimes true    Ran Out of Food in the Last Year: Sometimes true  Transportation Needs: No Transportation Needs (09/08/2023)   PRAPARE - Administrator, Civil Service (Medical): No    Lack of Transportation (Non-Medical): No  Physical Activity: Unknown (09/08/2023)   Exercise Vital Sign    Days of Exercise per Week: 0 days    Minutes of Exercise per Session: Not on file  Stress: No Stress Concern Present (09/08/2023)   [employer] of Occupational Health - Occupational Stress Questionnaire    Feeling of Stress : Not at all  Social Connections: Unknown (09/08/2023)   Social Connection and Isolation Panel [NHANES]    Frequency of Communication with Friends and Family: Once a week    Frequency of Social Gatherings with Friends and Family:  Never    Attends Religious Services: Patient declined    Active Member of Clubs or Organizations: No    Attends Banker Meetings: Not on file    Marital Status: Separated    No Known Allergies  Outpatient Medications Prior to Visit  Medication Sig Dispense Refill   glucose blood (ONETOUCH VERIO) test strip Use as instructed to check blood sugar daily. E11.65 100 each 11   ibuprofen (ADVIL,MOTRIN) 200 MG tablet Take 2 tablets (400 mg total) by mouth every 6 (six) hours as needed for moderate pain. 30 tablet 0   Misc. Devices MISC Home sleep study.  Diagnosis-obstructive sleep apnea 1 each 0   Misc. Devices MISC  AutoPAP 5-15cm H20. Diagnosis - Obstructive Sleep Apnea 1 each 0   OneTouch Delica Lancets 33G MISC Use as instructed to check blood sugar daily. E11.65 100 each 11   atorvastatin (LIPITOR) 40 MG tablet Take 1 tablet (40 mg total) by mouth daily. 90 tablet 1   dapagliflozin propanediol (FARXIGA) 10 MG TABS tablet Take 1 tablet (10 mg total) by mouth daily before breakfast. 90 tablet 1   glipiZIDE (GLUCOTROL XL) 10 MG 24 hr tablet Take 2 tablets (20 mg total) by mouth every evening. 180 tablet 1   metFORMIN (GLUCOPHAGE XR) 500 MG 24 hr tablet Take 4 tablets (2,000 mg total) by mouth every evening. 360 tablet 1   omeprazole (PRILOSEC) 20 MG capsule Take 1 capsule (20 mg total) by mouth daily. 90 capsule 1   No facility-administered medications prior to visit.     ROS Review of Systems  Constitutional:  Negative for activity change and appetite change.  HENT:  Negative for sinus pressure and sore throat.   Respiratory:  Negative for chest tightness, shortness of breath and wheezing.   Cardiovascular:  Negative for chest pain and palpitations.  Gastrointestinal:  Negative for abdominal distention, abdominal pain and constipation.  Genitourinary: Negative.   Musculoskeletal: Negative.   Psychiatric/Behavioral:  Negative for behavioral problems and dysphoric mood.     Objective:  BP (!) 168/89   Pulse 83   Ht 5\' 3"  (1.6 m)   Wt 227 lb 12.8 oz (103.3 kg)   SpO2 96%   BMI 40.35 kg/m      12/18/2023    4:24 PM 12/18/2023    3:57 PM 08/08/2023    3:30 PM  BP/Weight  Systolic BP 168 158 135  Diastolic BP 89 99 83  Wt. (Lbs)  227.8 230.4  BMI  40.35 kg/m2 40.81 kg/m2      Physical Exam Constitutional:      Appearance: Cory Lewis is well-developed.  Cardiovascular:     Rate and Rhythm: Normal rate.     Heart sounds: Normal heart sounds. No murmur heard. Pulmonary:     Effort: Pulmonary effort is normal.     Breath sounds: Normal breath sounds. No wheezing or rales.  Chest:     Chest  wall: No tenderness.  Abdominal:     General: Bowel sounds are normal. There is no distension.     Palpations: Abdomen is soft. There is no mass.     Tenderness: There is no abdominal tenderness.  Musculoskeletal:        General: Normal range of motion.     Right lower leg: No edema.     Left lower leg: No edema.  Neurological:     Mental Status: Cory Lewis is alert and oriented to person, place, and time.  Psychiatric:  Mood and Affect: Mood normal.        Latest Ref Rng & Units 08/08/2023    4:10 PM 01/14/2023    2:22 PM 06/08/2022   11:06 AM  CMP  Glucose 70 - 99 mg/dL 098  119  147   BUN 6 - 24 mg/dL 9  11  17    Creatinine 0.76 - 1.27 mg/dL 8.29  5.62  1.30   Sodium 134 - 144 mmol/L 139  141  137   Potassium 3.5 - 5.2 mmol/L 4.4  4.5  4.2   Chloride 96 - 106 mmol/L 100  101  95   CO2 20 - 29 mmol/L 19  22  22    Calcium 8.7 - 10.2 mg/dL 9.6  9.9  9.9   Total Protein 6.0 - 8.5 g/dL 7.1  7.0  7.3   Total Bilirubin 0.0 - 1.2 mg/dL <8.6  <5.7  0.3   Alkaline Phos 44 - 121 IU/L 62  64  68   AST 0 - 40 IU/L 28  16  28    ALT 0 - 44 IU/L 27  20  32     Lipid Panel     Component Value Date/Time   CHOL 130 06/08/2022 1106   TRIG 101 06/08/2022 1106   HDL 48 06/08/2022 1106   CHOLHDL 3.7 04/29/2020 1623   LDLCALC 63 06/08/2022 1106    CBC    Component Value Date/Time   WBC 6.5 06/08/2022 1106   WBC 4.9 08/08/2014 1850   RBC 4.54 06/08/2022 1106   RBC 4.36 08/08/2014 1850   HGB 13.9 06/08/2022 1106   HCT 40.3 06/08/2022 1106   PLT 321 06/08/2022 1106   MCV 89 06/08/2022 1106   MCH 30.6 06/08/2022 1106   MCH 29.6 08/08/2014 1850   MCHC 34.5 06/08/2022 1106   MCHC 33.2 08/08/2014 1850   RDW 12.1 06/08/2022 1106   LYMPHSABS 2.4 06/08/2022 1106   MONOABS 0.4 08/08/2014 1850   EOSABS 0.0 06/08/2022 1106   BASOSABS 0.0 06/08/2022 1106    Lab Results  Component Value Date   HGBA1C 7.6 (A) 10/10/2023       Assessment & Plan Hypertension Blood pressure  elevated, likely due to activity and stress. Weight loss noted, beneficial for control. Discussed lifestyle modifications and sodium management. - Recheck blood pressure before departure but blood pressure is still elevated - Schedule follow-up in one month. - Advise low sodium intake. - Encourage fresh or frozen vegetables as opposed to canned vegetables -Will reassess blood pressure in 1 month as his previous blood pressures have been normal  Type 2 Diabetes Mellitus A1c improved to 7.6%. Continued weight loss and lifestyle changes encouraged. -Goal A1c is less than 7 - Continue glipizide, metformin, and Farxiga. - Encourage weight loss and lifestyle modifications.  Hyperlipidemia associated with Type 2 Diabetes Mellitus On atorvastatin. Discussed statin benefits in reducing cardiovascular risk in diabetics over 40. -Khary to restart atorvastatin. - Order fasting lipid panel on Friday afternoon.  General Health Maintenance Colonoscopy not completed. Discussed importance of screening after age 40. - Provide contact for colonoscopy scheduling. - Encourage completion of colonoscopy.      Meds ordered this encounter  Medications   atorvastatin (LIPITOR) 40 MG tablet    Sig: Take 1 tablet (40 mg total) by mouth daily.    Dispense:  90 tablet    Refill:  1   dapagliflozin propanediol (FARXIGA) 10 MG TABS tablet    Sig: Take 1 tablet (10 mg  total) by mouth daily before breakfast.    Dispense:  90 tablet    Refill:  1   glipiZIDE (GLUCOTROL XL) 10 MG 24 hr tablet    Sig: Take 2 tablets (20 mg total) by mouth every evening.    Dispense:  180 tablet    Refill:  1   metFORMIN (GLUCOPHAGE XR) 500 MG 24 hr tablet    Sig: Take 4 tablets (2,000 mg total) by mouth every evening.    Dispense:  360 tablet    Refill:  1   omeprazole (PRILOSEC) 20 MG capsule    Sig: Take 1 capsule (20 mg total) by mouth daily.    Dispense:  90 capsule    Refill:  1    Follow-up: Return in about 1  month (around 01/18/2024) for Blood Pressure follow-up with PCP.       Hoy Register, MD, FAAFP. [facility] [place], [place] [phone]   12/18/2023, 5:02 PM

## 2023-12-18 NOTE — Patient Instructions (Signed)
 Please call for your Colonoscopy: Sent Referral to  [facility] [address] [place], [place] [address] [phone] Fax [phone]

## 2024-01-04 DIAGNOSIS — Z419 Encounter for procedure for purposes other than remedying health state, unspecified: Secondary | ICD-10-CM | POA: Diagnosis not present

## 2024-01-29 ENCOUNTER — Encounter (HOSPITAL_COMMUNITY): Payer: Self-pay

## 2024-02-03 DIAGNOSIS — Z419 Encounter for procedure for purposes other than remedying health state, unspecified: Secondary | ICD-10-CM | POA: Diagnosis not present

## 2024-02-05 ENCOUNTER — Ambulatory Visit: Admitting: Family Medicine

## 2024-03-05 DIAGNOSIS — Z419 Encounter for procedure for purposes other than remedying health state, unspecified: Secondary | ICD-10-CM | POA: Diagnosis not present

## 2024-03-30 ENCOUNTER — Telehealth: Payer: Self-pay | Admitting: Family Medicine

## 2024-03-30 NOTE — Telephone Encounter (Signed)
 Called pt to confirm appt. LVM for pt

## 2024-03-31 ENCOUNTER — Ambulatory Visit: Admitting: Family Medicine

## 2024-04-04 DIAGNOSIS — Z419 Encounter for procedure for purposes other than remedying health state, unspecified: Secondary | ICD-10-CM | POA: Diagnosis not present

## 2024-05-05 DIAGNOSIS — Z419 Encounter for procedure for purposes other than remedying health state, unspecified: Secondary | ICD-10-CM | POA: Diagnosis not present

## 2024-06-05 DIAGNOSIS — Z419 Encounter for procedure for purposes other than remedying health state, unspecified: Secondary | ICD-10-CM | POA: Diagnosis not present

## 2024-07-05 DIAGNOSIS — Z419 Encounter for procedure for purposes other than remedying health state, unspecified: Secondary | ICD-10-CM | POA: Diagnosis not present
